# Patient Record
Sex: Female | Born: 1980 | Hispanic: No | Marital: Married | Smoking: Never smoker
Health system: Southern US, Community
[De-identification: ages and names within clinical notes are randomized; demographics above are authoritative.]

## PROBLEM LIST (undated history)

## (undated) DIAGNOSIS — E7800 Pure hypercholesterolemia, unspecified: Secondary | ICD-10-CM

## (undated) DIAGNOSIS — O24419 Gestational diabetes mellitus in pregnancy, unspecified control: Secondary | ICD-10-CM

## (undated) HISTORY — PX: OTHER SURGICAL HISTORY: SHX169

## (undated) HISTORY — PX: TONSILLECTOMY: SUR1361

## (undated) HISTORY — PX: APPENDECTOMY: SHX54

---

## 2002-07-08 ENCOUNTER — Other Ambulatory Visit: Admission: RE | Admit: 2002-07-08 | Discharge: 2002-07-08 | Payer: Self-pay | Admitting: Obstetrics and Gynecology

## 2002-07-12 ENCOUNTER — Encounter: Payer: Self-pay | Admitting: Obstetrics and Gynecology

## 2002-07-12 ENCOUNTER — Ambulatory Visit (HOSPITAL_COMMUNITY): Admission: RE | Admit: 2002-07-12 | Discharge: 2002-07-12 | Payer: Self-pay | Admitting: Obstetrics and Gynecology

## 2002-09-28 ENCOUNTER — Encounter (HOSPITAL_COMMUNITY): Admission: RE | Admit: 2002-09-28 | Discharge: 2002-10-04 | Payer: Self-pay | Admitting: Obstetrics and Gynecology

## 2002-10-07 ENCOUNTER — Inpatient Hospital Stay (HOSPITAL_COMMUNITY): Admission: AD | Admit: 2002-10-07 | Discharge: 2002-10-09 | Payer: Self-pay | Admitting: Obstetrics and Gynecology

## 2002-10-11 ENCOUNTER — Encounter: Admission: RE | Admit: 2002-10-11 | Discharge: 2002-11-10 | Payer: Self-pay | Admitting: Obstetrics and Gynecology

## 2003-09-02 ENCOUNTER — Other Ambulatory Visit: Admission: RE | Admit: 2003-09-02 | Discharge: 2003-09-02 | Payer: Self-pay | Admitting: Obstetrics and Gynecology

## 2005-06-07 ENCOUNTER — Other Ambulatory Visit: Admission: RE | Admit: 2005-06-07 | Discharge: 2005-06-07 | Payer: Self-pay | Admitting: Obstetrics and Gynecology

## 2006-02-06 ENCOUNTER — Inpatient Hospital Stay (HOSPITAL_COMMUNITY): Admission: AD | Admit: 2006-02-06 | Discharge: 2006-02-06 | Payer: Self-pay | Admitting: Obstetrics and Gynecology

## 2006-02-28 ENCOUNTER — Inpatient Hospital Stay (HOSPITAL_COMMUNITY): Admission: AD | Admit: 2006-02-28 | Discharge: 2006-02-28 | Payer: Self-pay | Admitting: Internal Medicine

## 2006-03-06 ENCOUNTER — Inpatient Hospital Stay (HOSPITAL_COMMUNITY): Admission: RE | Admit: 2006-03-06 | Discharge: 2006-03-08 | Payer: Self-pay | Admitting: Obstetrics and Gynecology

## 2006-05-10 ENCOUNTER — Emergency Department (HOSPITAL_COMMUNITY): Admission: EM | Admit: 2006-05-10 | Discharge: 2006-05-10 | Payer: Self-pay | Admitting: Emergency Medicine

## 2006-10-01 ENCOUNTER — Emergency Department (HOSPITAL_COMMUNITY): Admission: EM | Admit: 2006-10-01 | Discharge: 2006-10-01 | Payer: Self-pay | Admitting: Family Medicine

## 2015-07-09 ENCOUNTER — Emergency Department (INDEPENDENT_AMBULATORY_CARE_PROVIDER_SITE_OTHER): Payer: Federal, State, Local not specified - PPO

## 2015-07-09 ENCOUNTER — Encounter (HOSPITAL_COMMUNITY): Payer: Self-pay | Admitting: *Deleted

## 2015-07-09 ENCOUNTER — Emergency Department (INDEPENDENT_AMBULATORY_CARE_PROVIDER_SITE_OTHER)
Admission: EM | Admit: 2015-07-09 | Discharge: 2015-07-09 | Disposition: A | Discharge status: Home / Self Care | Payer: Federal, State, Local not specified - PPO | Source: Home / Self Care

## 2015-07-09 DIAGNOSIS — M7989 Other specified soft tissue disorders: Secondary | ICD-10-CM

## 2015-07-09 HISTORY — DX: Pure hypercholesterolemia, unspecified: E78.00

## 2015-07-09 MED ORDER — IBUPROFEN 800 MG PO TABS
ORAL_TABLET | ORAL | Status: AC
  Filled 2015-07-09: qty 1

## 2015-07-09 MED ORDER — IBUPROFEN 800 MG PO TABS
800.0000 mg | ORAL_TABLET | Freq: Once | ORAL | Status: AC
  Administered 2015-07-09: 800 mg via ORAL

## 2015-07-09 NOTE — Discharge Instructions (Signed)
Edema Edema is an abnormal buildup of fluids. It is more common in your legs and thighs. Painless swelling of the feet and ankles is more likely as a person ages. It also is common in looser skin, like around your eyes. HOME CARE   Keep the affected body part above the level of the heart while lying down.  Do not sit still or stand for a long time.  Do not put anything right under your knees when you lie down.  Do not wear tight clothes on your upper legs.  Exercise your legs to help the puffiness (swelling) go down.  Wear elastic bandages or support stockings as told by your doctor.  A low-salt diet may help lessen the puffiness.  Only take medicine as told by your doctor. GET HELP IF:  Treatment is not working.  You have heart, liver, or kidney disease and notice that your skin looks puffy or shiny.  You have puffiness in your legs that does not get better when you raise your legs.  You have sudden weight gain for no reason. GET HELP RIGHT AWAY IF:   You have shortness of breath or chest pain.  You cannot breathe when you lie down.  You have pain, redness, or warmth in the areas that are puffy.  You have heart, liver, or kidney disease and get edema all of a sudden.  You have a fever and your symptoms get worse all of a sudden. MAKE SURE YOU:   Understand these instructions.  Will watch your condition.  Will get help right away if you are not doing well or get worse.   This information is not intended to replace advice given to you by your health care provider. Make sure you discuss any questions you have with your health care provider. It appears you have swelling and overuse of right hand(dominant). Your xray was negative for fracture. Please rest, elevate, wear velcro wrist splint for comfort. May take OTC tylenol or ibuprofen as label directed. Follow up wth your PCP at [facility] in 3 days if no improvement, sooner if worse. Return to Urgent Care as  needed.   Document Released: 12/25/2007 Document Revised: 07/13/2013 Document Reviewed: 04/30/2013 Elsevier Interactive Patient Education [employer]2016 Elsevier Inc.

## 2015-07-09 NOTE — ED Provider Notes (Signed)
CSN: [id number]     Arrival date & time 07/09/15  1322 History   None    Chief Complaint  Patient presents with  . Edema   (Consider location/radiation/quality/duration/timing/severity/associated sxs/prior Treatment) HPI Comments: Pt denies any trauma or known injury, no insect bite  Patient is a 34 y.o. female presenting with hand pain. The history is provided by the patient. No language interpreter was used.  Hand Pain This is a new problem. The current episode started yesterday. The problem occurs constantly. The problem has been gradually worsening. Pertinent negatives include no chest pain, no abdominal pain, no headaches and no shortness of breath. Exacerbated by: movement. The symptoms are relieved by position and rest (pt states if she holds her hand still feels better. ). She has tried rest for the symptoms. The treatment provided mild relief.    Past Medical History  Diagnosis Date  . Hypercholesterolemia    Past Surgical History  Procedure Laterality Date  . Tonsillectomy    . Appendectomy    . Cesarean section     No family history on file. Social History  Substance Use Topics  . Smoking status: Never Smoker   . Smokeless tobacco: None  . Alcohol Use: No   OB History    No data available     Review of Systems  Constitutional: Positive for activity change. Negative for fever.  HENT: Negative.   Eyes: Negative.   Respiratory: Negative for shortness of breath.   Cardiovascular: Negative for chest pain.  Gastrointestinal: Negative for abdominal pain.  Endocrine: Negative.   Genitourinary: Negative.   Musculoskeletal: Positive for myalgias and arthralgias. Negative for back pain and gait problem.  Skin: Negative for color change and wound.  Neurological: Negative for headaches.  Hematological: Negative.   Psychiatric/Behavioral: Negative.   All other systems reviewed and are negative.   Allergies  Review of patient's allergies indicates no known  allergies.  Home Medications   Prior to Admission medications   Medication Sig Start Date End Date Taking? Authorizing Provider  Norethin-Eth Estrad-Fe Biphas (LO LOESTRIN FE PO) Take by mouth.   Yes Historical Provider, MD   Meds Ordered and Administered this Visit   Medications  ibuprofen (ADVIL,MOTRIN) tablet 800 mg (800 mg Oral Given 07/09/15 1427)    BP 118/73 mmHg  Pulse 82  Temp(Src) 98.1 F (36.7 C) (Oral)  Resp 18  SpO2 100%  LMP 07/06/2015 (Exact Date) No data found.   Physical Exam  Constitutional: She is oriented to person, place, and time. Vital signs are normal. She appears well-developed and well-nourished. She is active and cooperative.  Non-toxic appearance. She does not have a sickly appearance. She does not appear ill. She appears distressed.  HENT:  Head: Normocephalic.  Right Ear: Tympanic membrane normal.  Left Ear: Tympanic membrane normal.  Nose: Nose normal.  Mouth/Throat: Uvula is midline and mucous membranes are normal.  Eyes: Pupils are equal, round, and reactive to light.  Neck: Trachea normal and normal range of motion. Muscular tenderness present. No Brudzinski's sign and no Kernig's sign noted.  Cardiovascular: Normal rate, regular rhythm and normal pulses.   Pulses:      Radial pulses are 2+ on the right side.  Pulmonary/Chest: Effort normal and breath sounds normal.  Musculoskeletal:       Right hand: She exhibits decreased range of motion, tenderness, bony tenderness and swelling. She exhibits normal two-point discrimination, normal capillary refill, no deformity and no laceration. Normal sensation noted. Normal strength noted.  Right hand + TTP of 2/3 metacarpal, + mild swelling. NV intact, cap refill <3 sec. Good distal sensation/movement. + Phalen, Tinel test right  Neurological: She is alert and oriented to person, place, and time. She has normal strength. No cranial nerve deficit or sensory deficit. GCS eye subscore is 4. GCS verbal  subscore is 5. GCS motor subscore is 6.  Skin: Skin is warm, dry and intact. No bruising, no ecchymosis and no rash noted.  Psychiatric: She has a normal mood and affect. Her speech is normal and behavior is normal.  Nursing note and vitals reviewed.   ED Course  Procedures (including critical care time)  Labs Review Labs Reviewed - No data to display  Imaging Review Dg Hand Complete Right  07/09/2015  CLINICAL DATA:  34 year old female with right hand pain for 1 day. No known injury. EXAM: RIGHT HAND - COMPLETE 3+ VIEW COMPARISON:  None. FINDINGS: There is no evidence of fracture, subluxation or dislocation. Mild dorsal soft tissue swelling is noted. No focal bony lesions are present. IMPRESSION: Mild soft tissue swelling without bony abnormality. Electronically Signed   By: Harmon Pier M.D.   On: 07/09/2015 15:22        MDM   1. Swelling of right hand     Xray and ibuprofen ordered at 1415:   1530: Discussed plan of care and results with pt: Right hand swelling, DDX: right hand edema, trauma, overuse injury, gout considered. It appears you have swelling and overuse of right hand(dominant). Your xray was negative for fracture. Right velcro splint applied by staff NV intact pre/post splint application. Please rest, elevate, wear velcro wrist splint for comfort. May take OTC tylenol or ibuprofen as label directed. Follow up wth your PCP at [facility] in 3 days if no improvement, sooner if worse. Return to Urgent Care as needed. Pt verbalized understanding to this provider.   Clancy Gourd, NP 07/09/15 [phone]

## 2015-07-09 NOTE — ED Notes (Signed)
C/O swelling to right hand with painful movement of hand.  Denies any injury.

## 2015-09-09 ENCOUNTER — Emergency Department (HOSPITAL_COMMUNITY)
Admission: EM | Admit: 2015-09-09 | Discharge: 2015-09-10 | Disposition: A | Discharge status: Home / Self Care | Payer: Federal, State, Local not specified - PPO | Attending: Emergency Medicine | Admitting: Emergency Medicine

## 2015-09-09 ENCOUNTER — Encounter (HOSPITAL_COMMUNITY): Payer: Self-pay

## 2015-09-09 DIAGNOSIS — E7800 Pure hypercholesterolemia, unspecified: Secondary | ICD-10-CM | POA: Insufficient documentation

## 2015-09-09 DIAGNOSIS — Z793 Long term (current) use of hormonal contraceptives: Secondary | ICD-10-CM | POA: Diagnosis not present

## 2015-09-09 DIAGNOSIS — R519 Headache, unspecified: Secondary | ICD-10-CM

## 2015-09-09 DIAGNOSIS — R300 Dysuria: Secondary | ICD-10-CM | POA: Insufficient documentation

## 2015-09-09 DIAGNOSIS — Z3202 Encounter for pregnancy test, result negative: Secondary | ICD-10-CM | POA: Diagnosis not present

## 2015-09-09 DIAGNOSIS — R51 Headache: Secondary | ICD-10-CM | POA: Diagnosis not present

## 2015-09-09 DIAGNOSIS — Z79899 Other long term (current) drug therapy: Secondary | ICD-10-CM | POA: Diagnosis not present

## 2015-09-09 LAB — URINALYSIS, ROUTINE W REFLEX MICROSCOPIC
Bilirubin Urine: NEGATIVE
GLUCOSE, UA: NEGATIVE mg/dL
KETONES UR: NEGATIVE mg/dL
LEUKOCYTES UA: NEGATIVE
NITRITE: NEGATIVE
PH: 6.5 (ref 5.0–8.0)
Protein, ur: NEGATIVE mg/dL
SPECIFIC GRAVITY, URINE: 1.012 (ref 1.005–1.030)

## 2015-09-09 LAB — URINE MICROSCOPIC-ADD ON: BACTERIA UA: NONE SEEN

## 2015-09-09 LAB — POC URINE PREG, ED: Preg Test, Ur: NEGATIVE

## 2015-09-09 NOTE — ED Notes (Signed)
Pt here fo head ache x 3 days, dark urine yesterday evening and reports yellowing of finger nails.

## 2015-09-09 NOTE — ED Provider Notes (Addendum)
CSN: [id number]     Arrival date & time 09/09/15  1903 History  By signing my name below, I, Alexa Erickson, attest that this documentation has been prepared under the direction and in the presence of Shon Baton, MD. Electronically Signed: Randell Erickson, ED Scribe. 09/10/2015. 1:19 AM.   Chief Complaint  Erickson presents with  . Headache  . Dysuria   The history is provided by the Erickson and medical records. No language interpreter was used.   HPI Comments: Alexa Erickson is a 35 y.o. female with an hx of hypercholesterolemia who presents to the Emergency Department complaining of a constant, gradually worsening, 6/10 HA onset 3 days ago. Erickson reports that she was seen at [facility]. Has worst headache of her life. Denies vision changes. Reports dark urine yesterday. She has taken Advil without relief. Per Erickson, she has not been drinking much fluids recently. She denies an hx of HAs or similar symptoms in the past. Denies dysuria, vomiting, photophobia, and neck pain.  Denies fever.  Past Medical History  Diagnosis Date  . Hypercholesterolemia    Past Surgical History  Procedure Laterality Date  . Tonsillectomy    . Appendectomy    . Cesarean section     History reviewed. No pertinent family history. Social History  Substance Use Topics  . Smoking status: Never Smoker   . Smokeless tobacco: None  . Alcohol Use: No   OB History    No data available     Review of Systems  Constitutional: Negative for fever.  Eyes: Negative for photophobia and visual disturbance.  Gastrointestinal: Negative for vomiting.  Genitourinary: Negative for dysuria.  Musculoskeletal: Negative for neck pain.  Neurological: Positive for headaches.  All other systems reviewed and are negative.     Allergies  Review of Erickson's allergies indicates no known allergies.  Home Medications   Prior to Admission medications   Medication Sig Start Date End Date Taking?  Authorizing Provider  atorvastatin (LIPITOR) 40 MG tablet Take 40 mg by mouth daily.   Yes Historical Provider, MD  ibuprofen (ADVIL,MOTRIN) 200 MG tablet Take 400 mg by mouth every 6 (six) hours as needed for headache or moderate pain.   Yes Historical Provider, MD  Vitamin D, Ergocalciferol, (DRISDOL) 50000 units CAPS capsule Take 50,000 Units by mouth every 7 (seven) days.   Yes Historical Provider, MD  Norethin-Eth Estrad-Fe Biphas (LO LOESTRIN FE PO) Take 1 tablet by mouth daily.     Historical Provider, MD   BP 142/85 mmHg  Pulse 79  Temp(Src) 98.5 F (36.9 C) (Oral)  Resp 20  Ht  (1.6 m)  Wt 169 lb (76.658 kg)  BMI 29.94 kg/m2  SpO2 100% Physical Exam  Constitutional: She is oriented to person, place, and time. She appears well-developed and well-nourished. No distress.  HENT:  Head: Normocephalic and atraumatic.  Eyes: EOM are normal. Pupils are equal, round, and reactive to light.  Cardiovascular: Normal rate, regular rhythm and normal heart sounds.   No murmur heard. Pulmonary/Chest: Effort normal and breath sounds normal. No respiratory distress. She has no wheezes.  Abdominal: Soft. There is no tenderness.  Neurological: She is alert and oriented to person, place, and time.  Cranial nerves II through XII intact, 5 out of 5 strength in all 4 extremities, no dysmetria to finger-nose-finger, visual fields intact  Skin: Skin is warm and dry.  Psychiatric: She has a normal mood and affect.  Nursing note and vitals reviewed.   ED  Course  Procedures   DIAGNOSTIC STUDIES: Oxygen Saturation is 100% on RA, normal by my interpretation.    COORDINATION OF CARE: 12:05 AM Discussed lab results with Erickson. Will order IV fluids, Benadryl, Toradol, and Compazine. Discussed treatment plan with pt at bedside and pt agreed to plan.   Labs Review Labs Reviewed  URINALYSIS, ROUTINE W REFLEX MICROSCOPIC (NOT AT [facility]) - Abnormal; Notable for the following:    Hgb urine dipstick  TRACE (*)    All other components within normal limits  URINE MICROSCOPIC-ADD ON - Abnormal; Notable for the following:    Squamous Epithelial / LPF 0-5 (*)    All other components within normal limits  URINE CULTURE  POC URINE PREG, ED    Imaging Review No results found. I have personally reviewed and evaluated these images and lab results as part of my medical decision-making.   EKG Interpretation None      MDM   Final diagnoses:  Acute nonintractable headache, unspecified headache type    Erickson presents with headache. No history of headaches. Onset 3 days ago. Denies worst headache of her life. Is comfortable appearing. Neurologically intact. No additional signs or symptoms. No red flags. She is afebrile without neck pain. Doubt meningitis or subarachnoid hemorrhage. She is on birth control which would put her at risk for possible venous dural thrombosis; however, she is without neurologic deficit or further complaint. Her headache improved with a migraine cocktail. No indication at this time for emergent imaging. If headache recurs or worsens, she will need to be reevaluated.  After history, exam, and medical workup I feel the Erickson has been appropriately medically screened and is safe for discharge home. Pertinent diagnoses were discussed with the Erickson. Erickson was given return precautions.  I personally performed the services described in this documentation, which was scribed in my presence. The recorded information has been reviewed and is accurate.    Shon Baton, MD 09/10/15 0120  Shon Baton, MD 09/10/15 [phone]

## 2015-09-10 MED ORDER — SODIUM CHLORIDE 0.9 % IV BOLUS (SEPSIS): 1000.0000 mL | Freq: Once | INTRAVENOUS | Status: DC

## 2015-09-10 MED ORDER — PROCHLORPERAZINE EDISYLATE 5 MG/ML IJ SOLN
10.0000 mg | Freq: Four times a day (QID) | INTRAMUSCULAR | Status: DC | PRN
  Administered 2015-09-10: 10 mg via INTRAVENOUS
  Filled 2015-09-10: qty 2

## 2015-09-10 MED ORDER — KETOROLAC TROMETHAMINE 30 MG/ML IJ SOLN
30.0000 mg | Freq: Once | INTRAMUSCULAR | Status: AC
  Administered 2015-09-10: 30 mg via INTRAVENOUS
  Filled 2015-09-10: qty 1

## 2015-09-10 MED ORDER — DIPHENHYDRAMINE HCL 50 MG/ML IJ SOLN
25.0000 mg | Freq: Once | INTRAMUSCULAR | Status: AC
  Administered 2015-09-10: 25 mg via INTRAVENOUS
  Filled 2015-09-10: qty 1

## 2015-09-10 NOTE — ED Notes (Signed)
Saline bag would not scan, was administered.

## 2015-09-10 NOTE — Discharge Instructions (Signed)

## 2015-09-11 LAB — URINE CULTURE

## 2016-01-31 ENCOUNTER — Encounter (HOSPITAL_COMMUNITY): Payer: Self-pay | Admitting: Emergency Medicine

## 2016-01-31 ENCOUNTER — Ambulatory Visit (HOSPITAL_COMMUNITY)
Admission: EM | Admit: 2016-01-31 | Discharge: 2016-01-31 | Disposition: A | Discharge status: Home / Self Care | Payer: Federal, State, Local not specified - PPO | Attending: Family Medicine | Admitting: Family Medicine

## 2016-01-31 DIAGNOSIS — Z8249 Family history of ischemic heart disease and other diseases of the circulatory system: Secondary | ICD-10-CM | POA: Diagnosis not present

## 2016-01-31 DIAGNOSIS — J069 Acute upper respiratory infection, unspecified: Secondary | ICD-10-CM | POA: Diagnosis not present

## 2016-01-31 DIAGNOSIS — J029 Acute pharyngitis, unspecified: Secondary | ICD-10-CM | POA: Diagnosis present

## 2016-01-31 DIAGNOSIS — Z79899 Other long term (current) drug therapy: Secondary | ICD-10-CM | POA: Insufficient documentation

## 2016-01-31 DIAGNOSIS — E7800 Pure hypercholesterolemia, unspecified: Secondary | ICD-10-CM | POA: Insufficient documentation

## 2016-01-31 LAB — POCT RAPID STREP A: Streptococcus, Group A Screen (Direct): NEGATIVE

## 2016-01-31 MED ORDER — BENZONATATE 200 MG PO CAPS: 200.0000 mg | ORAL_CAPSULE | Freq: Three times a day (TID) | ORAL | Status: DC | PRN

## 2016-01-31 MED ORDER — AMOXICILLIN 500 MG PO CAPS
500.0000 mg | ORAL_CAPSULE | Freq: Three times a day (TID) | ORAL | Status: DC
Start: 2016-01-31 — End: 2019-08-19

## 2016-01-31 NOTE — Discharge Instructions (Signed)
Upper Respiratory Infection, Adult Most upper respiratory infections (URIs) are a viral infection of the air passages leading to the lungs. A URI affects the nose, throat, and upper air passages. The most common type of URI is nasopharyngitis and is typically referred to as "the common cold." URIs run their course and usually go away on their own. Most of the time, a URI does not require medical attention, but sometimes a bacterial infection in the upper airways can follow a viral infection. This is called a secondary infection. Sinus and middle ear infections are common types of secondary upper respiratory infections. Bacterial pneumonia can also complicate a URI. A URI can worsen asthma and chronic obstructive pulmonary disease (COPD). Sometimes, these complications can require emergency medical care and may be life threatening.  CAUSES Almost all URIs are caused by viruses. A virus is a type of germ and can spread from one person to another.  RISKS FACTORS You may be at risk for a URI if:   You smoke.   You have chronic heart or lung disease.  You have a weakened defense (immune) system.   You are very young or very old.   You have nasal allergies or asthma.  You work in crowded or poorly ventilated areas.  You work in health care facilities or schools. SIGNS AND SYMPTOMS  Symptoms typically develop 2-3 days after you come in contact with a cold virus. Most viral URIs last 7-10 days. However, viral URIs from the influenza virus (flu virus) can last 14-18 days and are typically more severe. Symptoms may include:   Runny or stuffy (congested) nose.   Sneezing.   Cough.   Sore throat.   Headache.   Fatigue.   Fever.   Loss of appetite.   Pain in your forehead, behind your eyes, and over your cheekbones (sinus pain).  Muscle aches.  DIAGNOSIS  Your health care provider may diagnose a URI by:  Physical exam.  Tests to check that your symptoms are not due to  another condition such as:  Strep throat.  Sinusitis.  Pneumonia.  Asthma. TREATMENT  A URI goes away on its own with time. It cannot be cured with medicines, but medicines may be prescribed or recommended to relieve symptoms. Medicines may help:  Reduce your fever.  Reduce your cough.  Relieve nasal congestion. HOME CARE INSTRUCTIONS   Take medicines only as directed by your health care provider.   Gargle warm saltwater or take cough drops to comfort your throat as directed by your health care provider.  Use a warm mist humidifier or inhale steam from a shower to increase air moisture. This may make it easier to breathe.  Drink enough fluid to keep your urine clear or pale yellow.   Eat soups and other clear broths and maintain good nutrition.   Rest as needed.   Return to work when your temperature has returned to normal or as your health care provider advises. You may need to stay home longer to avoid infecting others. You can also use a face mask and careful hand washing to prevent spread of the virus.  Increase the usage of your inhaler if you have asthma.   Do not use any tobacco products, including cigarettes, chewing tobacco, or electronic cigarettes. If you need help quitting, ask your health care provider. PREVENTION  The best way to protect yourself from getting a cold is to practice good hygiene.   Avoid oral or hand contact with people with cold   symptoms.   Wash your hands often if contact occurs.  There is no clear evidence that vitamin C, vitamin E, echinacea, or exercise reduces the chance of developing a cold. However, it is always recommended to get plenty of rest, exercise, and practice good nutrition.  SEEK MEDICAL CARE IF:   You are getting worse rather than better.   Your symptoms are not controlled by medicine.   You have chills.  You have worsening shortness of breath.  You have brown or red mucus.  You have yellow or brown nasal  discharge.  You have pain in your face, especially when you bend forward.  You have a fever.  You have swollen neck glands.  You have pain while swallowing.  You have white areas in the back of your throat. SEEK IMMEDIATE MEDICAL CARE IF:   You have severe or persistent:  Headache.  Ear pain.  Sinus pain.  Chest pain.  You have chronic lung disease and any of the following:  Wheezing.  Prolonged cough.  Coughing up blood.  A change in your usual mucus.  You have a stiff neck.  You have changes in your:  Vision.  Hearing.  Thinking.  Mood. MAKE SURE YOU:   Understand these instructions.  Will watch your condition.  Will get help right away if you are not doing well or get worse.   This information is not intended to replace advice given to you by your health care provider. Make sure you discuss any questions you have with your health care provider.   Document Released: 01/01/2001 Document Revised: 11/22/2014 Document Reviewed: 10/13/2013 Elsevier Interactive Patient Education 2016 Elsevier Inc.  

## 2016-01-31 NOTE — ED Provider Notes (Signed)
CSN: [id number]     Arrival date & time 01/31/16  1948 History   First MD Initiated Contact with Patient 01/31/16 2030     Chief Complaint  Patient presents with  . Sore Throat   (Consider location/radiation/quality/duration/timing/severity/associated sxs/prior Treatment) HPI History obtained from patient:  Pt presents with the cc of:  Sore throat, cough Duration of symptoms: Several days Treatment prior to arrival: Over-the-counter medications without relief of symptoms Context: Sudden onset of cough and sore throat. She has children with similar symptoms at home. Other symptoms include: No fevers Pain score: 0 FAMILY HISTORY: Hypertension    Past Medical History  Diagnosis Date  . Hypercholesterolemia    Past Surgical History  Procedure Laterality Date  . Tonsillectomy    . Appendectomy    . Cesarean section     No family history on file. Social History  Substance Use Topics  . Smoking status: Never Smoker   . Smokeless tobacco: Not on file  . Alcohol Use: No   OB History    No data available     Review of Systems  Denies: HEADACHE, NAUSEA, ABDOMINAL PAIN, CHEST PAIN, CONGESTION, DYSURIA, SHORTNESS OF BREATH  Allergies  Review of patient's allergies indicates no known allergies.  Home Medications   Prior to Admission medications   Medication Sig Start Date End Date Taking? Authorizing Provider  amoxicillin (AMOXIL) 500 MG capsule Take 1 capsule (500 mg total) by mouth 3 (three) times daily. 01/31/16   Tharon AquasFrank C Patrick, PA  atorvastatin (LIPITOR) 40 MG tablet Take 40 mg by mouth daily.    Historical Provider, MD  benzonatate (TESSALON) 200 MG capsule Take 1 capsule (200 mg total) by mouth 3 (three) times daily as needed for cough. 01/31/16   Tharon AquasFrank C Patrick, PA  ibuprofen (ADVIL,MOTRIN) 200 MG tablet Take 400 mg by mouth every 6 (six) hours as needed for headache or moderate pain.    Historical Provider, MD  Norethin-Eth Estrad-Fe Biphas (LO LOESTRIN FE PO) Take 1  tablet by mouth daily.     Historical Provider, MD  Vitamin D, Ergocalciferol, (DRISDOL) 50000 units CAPS capsule Take 50,000 Units by mouth every 7 (seven) days.    Historical Provider, MD   Meds Ordered and Administered this Visit  Medications - No data to display  BP 104/51 mmHg  Pulse 82  Temp(Src) 98.5 F (36.9 C) (Oral)  Resp 16  SpO2 99% No data found.   Physical Exam   NURSES NOTES AND VITAL SIGNS REVIEWED. CONSTITUTIONAL: Well developed, well nourished, no acute distress HEENT: normocephalic, atraumatic EYES: Conjunctiva normal NECK:normal ROM, supple, no adenopathy PULMONARY:No respiratory distress, normal effort ABDOMINAL: Soft, ND, NT BS+, No CVAT MUSCULOSKELETAL: Normal ROM of all extremities,  SKIN: warm and dry without rash PSYCHIATRIC: Mood and affect, behavior are normal    ED Course  Procedures (including critical care time)  Labs Review Labs Reviewed  POCT RAPID STREP A    Imaging Review No results found.   Visual Acuity Review  Right Eye Distance:   Left Eye Distance:   Bilateral Distance:    Right Eye Near:   Left Eye Near:    Bilateral Near:         MDM   1. URI (upper respiratory infection)     Patient is reassured that there are no issues that require transfer to higher level of care at this time or additional tests. Patient is advised to continue home symptomatic treatment. Patient is advised that if there are  new or worsening symptoms to attend the emergency department, contact primary care provider, or return to UC. Instructions of care provided discharged home in stable condition.    THIS NOTE WAS GENERATED USING A VOICE RECOGNITION SOFTWARE PROGRAM. ALL REASONABLE EFFORTS  WERE MADE TO PROOFREAD THIS DOCUMENT FOR ACCURACY.  I have verbally reviewed the discharge instructions with the patient. A printed AVS was given to the patient.  All questions were answered prior to discharge.      Tharon Aquas, PA 01/31/16  2056

## 2016-02-03 LAB — CULTURE, GROUP A STREP (THRC)

## 2016-05-06 DIAGNOSIS — Z01419 Encounter for gynecological examination (general) (routine) without abnormal findings: Secondary | ICD-10-CM | POA: Diagnosis not present

## 2016-05-06 DIAGNOSIS — Z6829 Body mass index (BMI) 29.0-29.9, adult: Secondary | ICD-10-CM | POA: Diagnosis not present

## 2016-05-06 DIAGNOSIS — Z130 Encounter for screening for diseases of the blood and blood-forming organs and certain disorders involving the immune mechanism: Secondary | ICD-10-CM | POA: Diagnosis not present

## 2016-05-06 DIAGNOSIS — Z3041 Encounter for surveillance of contraceptive pills: Secondary | ICD-10-CM | POA: Diagnosis not present

## 2016-05-06 DIAGNOSIS — Z1389 Encounter for screening for other disorder: Secondary | ICD-10-CM | POA: Diagnosis not present

## 2016-07-30 DIAGNOSIS — E559 Vitamin D deficiency, unspecified: Secondary | ICD-10-CM | POA: Diagnosis not present

## 2016-07-30 DIAGNOSIS — Z0000 Encounter for general adult medical examination without abnormal findings: Secondary | ICD-10-CM | POA: Diagnosis not present

## 2016-07-30 DIAGNOSIS — E7801 Familial hypercholesterolemia: Secondary | ICD-10-CM | POA: Diagnosis not present

## 2016-09-12 DIAGNOSIS — K080 Exfoliation of teeth due to systemic causes: Secondary | ICD-10-CM | POA: Diagnosis not present

## 2016-12-27 ENCOUNTER — Ambulatory Visit (HOSPITAL_COMMUNITY)
Admission: EM | Admit: 2016-12-27 | Discharge: 2016-12-27 | Disposition: A | Discharge status: Home / Self Care | Payer: Federal, State, Local not specified - PPO | Attending: Internal Medicine | Admitting: Internal Medicine

## 2016-12-27 ENCOUNTER — Encounter (HOSPITAL_COMMUNITY): Payer: Self-pay

## 2016-12-27 DIAGNOSIS — R0789 Other chest pain: Secondary | ICD-10-CM

## 2016-12-27 DIAGNOSIS — M940 Chondrocostal junction syndrome [Tietze]: Secondary | ICD-10-CM | POA: Diagnosis not present

## 2016-12-27 NOTE — ED Provider Notes (Signed)
CSN: [id number]     Arrival date & time 12/27/16  1304 History   First MD Initiated Contact with Patient 12/27/16 1410     Chief Complaint  Patient presents with  . Chest Pain   (Consider location/radiation/quality/duration/timing/severity/associated sxs/prior Treatment) 36 year old female from [place] speaks English is complaining of left anterior chest pain that radiates to the left lateral chest. For the first couple weeks it would come and go. But in the past 3 days has been more constant. She describes it as a pressure and points toward the center of the chest and sometimes the left upper anterior chest and left lateral chest. It occurs now constantly nearly every day. He is not affected by movement or breathing. Denies shortness of breath, diaphoresis, weakness or fatigue. 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      Past Medical History:  Diagnosis Date  . Hypercholesterolemia    Past Surgical History:  Procedure Laterality Date  . APPENDECTOMY    . CESAREAN SECTION    . TONSILLECTOMY     No family history on file. Social History  Substance Use Topics  . Smoking status: Never Smoker  . Smokeless tobacco: Never Used  . Alcohol use No   OB History    No data available     Review of Systems  Constitutional: Negative.   HENT: Negative.   Respiratory: Negative.  Negative for cough and shortness of breath.   Cardiovascular: Positive for chest pain. Negative for palpitations and leg swelling.  Gastrointestinal: Negative.   Genitourinary: Negative.   Skin: Negative.   Neurological: Negative.   Psychiatric/Behavioral: Negative.   All other systems reviewed and are negative.   Allergies  Patient has no known allergies.  Home Medications   Prior to Admission medications   Medication Sig Start Date End Date Taking? Authorizing Provider  atorvastatin (LIPITOR) 40 MG tablet Take 40 mg by mouth daily.   Yes [provider]   Vitamin D, Ergocalciferol, (DRISDOL) 50000 units CAPS capsule Take 50,000 Units by mouth every 7 (seven) days.   Yes [provider]  amoxicillin (AMOXIL) 500 MG capsule Take 1 capsule (500 mg total) by mouth 3 (three) times daily. 01/31/16   Tharon AquasPatrick, Frank C, PA  benzonatate (TESSALON) 200 MG capsule Take 1 capsule (200 mg total) by mouth 3 (three) times daily as needed for cough. 01/31/16   Tharon AquasPatrick, Frank C, PA  ibuprofen (ADVIL,MOTRIN) 200 MG tablet Take 400 mg by mouth every 6 (six) hours as needed for headache or moderate pain.    [provider]  Norethin-Eth Estrad-Fe Biphas (LO LOESTRIN FE PO) Take 1 tablet by mouth daily.     [provider]   Meds Ordered and Administered this Visit  Medications - No data to display  BP 122/81 (BP Location: Right Arm)   Pulse 80   Temp 98.5 F (36.9 C) (Oral)   Resp 20   LMP 11/28/2016 (Exact Date)   SpO2 98%  No data found.   Physical Exam  Constitutional: She is oriented to person, place, and time. She appears well-developed and well-nourished. No distress.  HENT:  Right Ear: External ear normal.  Left Ear: External ear normal.  Eyes: EOM are normal.  Neck: Normal range of motion. Neck supple.  Cardiovascular: Normal rate, regular rhythm, normal heart sounds and intact distal pulses.   Pulmonary/Chest: Effort normal and breath sounds normal. No respiratory distress. She has no wheezes. She has  no rales. She exhibits tenderness.  Reproducible chest wall tenderness along the parasternal borders greatest on the right. Lesser tenderness to the left upper most anterior chest. Tenderness to the left lateral chest at the posterior axillary line and a small area.  Lungs are perfectly clear. Excellent expansion and air movement.  Musculoskeletal: Normal range of motion. She exhibits no edema.  Neurological: She is alert and oriented to person, place, and time.  Skin: Skin is warm and dry. Capillary refill takes less than  2 seconds. No erythema.  Psychiatric: She has a normal mood and affect. Her behavior is normal. Thought content normal.  Nursing note and vitals reviewed.   Urgent Care Course     Procedures (including critical care time)  Labs Review Labs Reviewed - No data to display  Imaging Review No results found.  ED ECG REPORT   Date: 12/27/2016  Rate: 68  Rhythm: normal sinus rhythm  QRS Axis: normal  Intervals: normal  ST/T Wave abnormalities: normal  Conduction Disutrbances:none  Narrative Interpretation:   Old EKG Reviewed: unchanged  I have personally reviewed the EKG tracing and agree with the computerized printout as noted. Visual Acuity Review  Right Eye Distance:   Left Eye Distance:   Bilateral Distance:    Right Eye Near:   Left Eye Near:    Bilateral Near:         MDM   1. Chest wall pain   2. Costochondritis    There are no convincing signs or symptoms of heart or lung disease at this time. You do have reproducible chest wall tenderness which is reassuring in regards to the diagnosis. This means you have tenderness in the cartilage adjacent to the breast bone as well as other parts of the chest. Your heart sounds normal, regular rate and rhythm, lungs are clear and your EKG is normal. Place ice on the areas of the chest with the greatest amount of discomfort. He may also take ibuprofen 600 mg every 6 hours as needed for discomfort. Realized that this type pain sometimes can last for several weeks. Follow-up with your primary care doctor as needed.    Hayden Rasmussen, NP 12/27/16 1428

## 2016-12-27 NOTE — ED Triage Notes (Signed)
Pt has been having chest pain for 2 weeks intermittently and now has been having it non stop for the past two days. It's on the left side of her chest radiating to her left side and is having some pain/tightness in her upper shoulder area on the right side and back of her neck. Did try bengay and it didn't help.

## 2016-12-27 NOTE — Discharge Instructions (Signed)
There are no convincing signs or symptoms of heart or lung disease at this time. You do have reproducible chest wall tenderness which is reassuring in regards to the diagnosis. This means you have tenderness in the cartilage adjacent to the breast bone as well as other parts of the chest. Your heart sounds normal, regular rate and rhythm, lungs are clear and your EKG is normal. Place ice on the areas of the chest with the greatest amount of discomfort. He may also take ibuprofen 600 mg every 6 hours as needed for discomfort. Realized that this type pain sometimes can last for several weeks. Follow-up with your primary care doctor as needed.

## 2017-03-18 DIAGNOSIS — K080 Exfoliation of teeth due to systemic causes: Secondary | ICD-10-CM | POA: Diagnosis not present

## 2017-04-17 DIAGNOSIS — K080 Exfoliation of teeth due to systemic causes: Secondary | ICD-10-CM | POA: Diagnosis not present

## 2017-08-02 ENCOUNTER — Ambulatory Visit (INDEPENDENT_AMBULATORY_CARE_PROVIDER_SITE_OTHER): Payer: Federal, State, Local not specified - PPO

## 2017-08-02 ENCOUNTER — Ambulatory Visit (HOSPITAL_COMMUNITY)
Admission: EM | Admit: 2017-08-02 | Discharge: 2017-08-02 | Disposition: A | Discharge status: Home / Self Care | Payer: Federal, State, Local not specified - PPO | Attending: Family Medicine | Admitting: Family Medicine

## 2017-08-02 ENCOUNTER — Encounter (HOSPITAL_COMMUNITY): Payer: Self-pay | Admitting: *Deleted

## 2017-08-02 ENCOUNTER — Other Ambulatory Visit: Payer: Self-pay

## 2017-08-02 DIAGNOSIS — R0789 Other chest pain: Secondary | ICD-10-CM | POA: Diagnosis not present

## 2017-08-02 DIAGNOSIS — R05 Cough: Secondary | ICD-10-CM | POA: Diagnosis not present

## 2017-08-02 DIAGNOSIS — R079 Chest pain, unspecified: Secondary | ICD-10-CM | POA: Diagnosis not present

## 2017-08-02 MED ORDER — NAPROXEN 500 MG PO TABS
500.0000 mg | ORAL_TABLET | Freq: Two times a day (BID) | ORAL | 0 refills | Status: DC
Start: 2017-08-02 — End: 2019-08-19

## 2017-08-02 NOTE — ED Provider Notes (Signed)
[facility]    CSN: [id number] Arrival date & time: 08/02/17  1421     History   Chief Complaint Chief Complaint  Patient presents with  . Chest Pain    HPI Alexa Erickson is a 37 y.o. female.   37 year old female, presenting today complaining of right-sided chest pain with cough.  Describes the pain is a pressure sensation that causes her to cough.  States that she has had this several times in the past for which she has been evaluated here.  States that last time, she was told it was due to inflammation.  She denies any shortness of breath, fever, chills, leg swelling.  Denies any hemoptysis or recent travel, surgeries or prolonged immobilization.  Denies history of DVT/PE.   The history is provided by the patient.  Chest Pain  Pain location:  R chest Pain quality: aching   Pain radiates to:  Does not radiate Pain severity:  Mild Onset quality:  Gradual Duration:  1 day Timing:  Intermittent Progression:  Unchanged Chronicity:  Recurrent Context: not breathing, not drug use, not eating, not intercourse, not lifting, not movement, not raising an arm, not at rest, not stress and not trauma   Context comment:  Coughing  Relieved by:  Nothing Worsened by:  Nothing Ineffective treatments:  None tried Associated symptoms: cough   Associated symptoms: no abdominal pain, no AICD problem, no altered mental status, no anorexia, no anxiety, no back pain, no claudication, no diaphoresis, no dizziness, no dysphagia, no fatigue, no fever, no headache, no heartburn, no lower extremity edema, no nausea, no near-syncope, no numbness, no orthopnea, no palpitations, no PND, no shortness of breath, no syncope, no vomiting and no weakness   Risk factors: birth control (OCP)   Risk factors: no aortic disease, no coronary artery disease, no diabetes mellitus, no Ehlers-Danlos syndrome, no high cholesterol, no hypertension, no immobilization, not female, no Marfan's syndrome, not  obese, not pregnant, no prior DVT/PE, no smoking and no surgery     Past Medical History:  Diagnosis Date  . Hypercholesterolemia     There are no active problems to display for this patient.   Past Surgical History:  Procedure Laterality Date  . APPENDECTOMY    . CESAREAN SECTION    . TONSILLECTOMY      OB History    No data available       Home Medications    Prior to Admission medications   Medication Sig Start Date End Date Taking? Authorizing Provider  atorvastatin (LIPITOR) 40 MG tablet Take 40 mg by mouth daily.   Yes [provider]  Norethin-Eth Estrad-Fe Biphas (LO LOESTRIN FE PO) Take 1 tablet by mouth daily.    Yes [provider]  Vitamin D, Ergocalciferol, (DRISDOL) 50000 units CAPS capsule Take 50,000 Units by mouth every 7 (seven) days.   Yes [provider]  amoxicillin (AMOXIL) 500 MG capsule Take 1 capsule (500 mg total) by mouth 3 (three) times daily. 01/31/16   Tharon Aquas, PA  benzonatate (TESSALON) 200 MG capsule Take 1 capsule (200 mg total) by mouth 3 (three) times daily as needed for cough. 01/31/16   Tharon Aquas, PA  ibuprofen (ADVIL,MOTRIN) 200 MG tablet Take 400 mg by mouth every 6 (six) hours as needed for headache or moderate pain.    [provider]  naproxen (NAPROSYN) 500 MG tablet Take 1 tablet (500 mg total) by mouth 2 (two) times daily. 08/02/17   Blue,  Marylene Landlivia C, PA-C    Family History No family history on file.  Social History Social History   Tobacco Use  . Smoking status: Never Smoker  . Smokeless tobacco: Never Used  Substance Use Topics  . Alcohol use: No  . Drug use: No     Allergies   Patient has no known allergies.   Review of Systems Review of Systems  Constitutional: Negative for chills, diaphoresis, fatigue and fever.  HENT: Negative for ear pain, sore throat and trouble swallowing.   Eyes: Negative for pain and visual disturbance.  Respiratory: Positive for cough.  Negative for shortness of breath.   Cardiovascular: Positive for chest pain. Negative for palpitations, orthopnea, claudication, syncope, PND and near-syncope.  Gastrointestinal: Negative for abdominal pain, anorexia, heartburn, nausea and vomiting.  Genitourinary: Negative for dysuria and hematuria.  Musculoskeletal: Negative for arthralgias and back pain.  Skin: Negative for color change and rash.  Neurological: Negative for dizziness, seizures, syncope, weakness, numbness and headaches.  All other systems reviewed and are negative.    Physical Exam Triage Vital Signs ED Triage Vitals  Enc Vitals Group     BP 08/02/17 1508 121/80     Pulse Rate 08/02/17 1508 82     Resp 08/02/17 1508 16     Temp 08/02/17 1508 98 F (36.7 C)     Temp Source 08/02/17 1508 Oral     SpO2 08/02/17 1508 100 %     Weight --      Height --      Head Circumference --      Peak Flow --      Pain Score 08/02/17 1511 8     Pain Loc --      Pain Edu? --      Excl. in GC? --    No data found.  Updated Vital Signs BP 121/80 (BP Location: Left Arm)   Pulse 82   Temp 98 F (36.7 C) (Oral)   Resp 16   LMP 07/27/2017 (Exact Date)   SpO2 100%   Visual Acuity Right Eye Distance:   Left Eye Distance:   Bilateral Distance:    Right Eye Near:   Left Eye Near:    Bilateral Near:     Physical Exam  Constitutional: She appears well-developed and well-nourished. No distress.  HENT:  Head: Normocephalic and atraumatic.  Eyes: Conjunctivae are normal.  Neck: Neck supple.  Cardiovascular: Normal rate and regular rhythm.  No murmur heard. Pulmonary/Chest: Effort normal and breath sounds normal. No stridor. No respiratory distress. She has no decreased breath sounds. She has no wheezes. She has no rhonchi. She has no rales. She exhibits no tenderness.  Abdominal: Soft. There is no tenderness.  Musculoskeletal: She exhibits no edema.  Neurological: She is alert.  Skin: Skin is warm and dry.    Psychiatric: She has a normal mood and affect.  Nursing note and vitals reviewed.    UC Treatments / Results  Labs (all labs ordered are listed, but only abnormal results are displayed) Labs Reviewed - No data to display  EKG  EKG Interpretation None       Radiology Dg Chest 2 View  Result Date: 08/02/2017 CLINICAL DATA:  Cough, chest pain EXAM: CHEST  2 VIEW COMPARISON:  None. FINDINGS: Heart and mediastinal contours are within normal limits. No focal opacities or effusions. No acute bony abnormality. IMPRESSION: No active cardiopulmonary disease. Electronically Signed   By: Charlett NoseKevin  Dover M.D.   On: 08/02/2017 15:58  Procedures Procedures (including critical care time)  Medications Ordered in UC Medications - No data to display   Initial Impression / Assessment and Plan / UC Course  I have reviewed the triage vital signs and the nursing notes.  Pertinent labs & imaging results that were available during my care of the patient were reviewed by me and considered in my medical decision making (see chart for details).     EKG shows normal sinus rhythm Normal chest x-ray Pain likely secondary to chest wall pain We will treat symptomatically with anti-inflammatories  Final Clinical Impressions(s) / UC Diagnoses   Final diagnoses:  Chest wall pain    ED Discharge Orders        Ordered    naproxen (NAPROSYN) 500 MG tablet  2 times daily     08/02/17 1609       Controlled Substance Prescriptions [place] Controlled Substance Registry consulted? Not Applicable   Alecia Lemming, [place] 08/02/17 1609

## 2017-08-02 NOTE — ED Triage Notes (Signed)
Pt c/o chest pressure which causes her to cough, followed by relief of pressure.  States has had same pain in past & has been told "it's inflammation".

## 2017-08-18 DIAGNOSIS — E559 Vitamin D deficiency, unspecified: Secondary | ICD-10-CM | POA: Diagnosis not present

## 2017-08-18 DIAGNOSIS — E785 Hyperlipidemia, unspecified: Secondary | ICD-10-CM | POA: Diagnosis not present

## 2017-09-12 DIAGNOSIS — Z124 Encounter for screening for malignant neoplasm of cervix: Secondary | ICD-10-CM | POA: Diagnosis not present

## 2017-09-12 DIAGNOSIS — Z01419 Encounter for gynecological examination (general) (routine) without abnormal findings: Secondary | ICD-10-CM | POA: Diagnosis not present

## 2017-09-12 DIAGNOSIS — Z3041 Encounter for surveillance of contraceptive pills: Secondary | ICD-10-CM | POA: Diagnosis not present

## 2017-09-12 DIAGNOSIS — Z1389 Encounter for screening for other disorder: Secondary | ICD-10-CM | POA: Diagnosis not present

## 2017-09-12 DIAGNOSIS — Z6830 Body mass index (BMI) 30.0-30.9, adult: Secondary | ICD-10-CM | POA: Diagnosis not present

## 2017-09-12 DIAGNOSIS — Z1151 Encounter for screening for human papillomavirus (HPV): Secondary | ICD-10-CM | POA: Diagnosis not present

## 2017-09-12 DIAGNOSIS — Z130 Encounter for screening for diseases of the blood and blood-forming organs and certain disorders involving the immune mechanism: Secondary | ICD-10-CM | POA: Diagnosis not present

## 2017-10-31 DIAGNOSIS — E785 Hyperlipidemia, unspecified: Secondary | ICD-10-CM | POA: Diagnosis not present

## 2017-10-31 DIAGNOSIS — Z0000 Encounter for general adult medical examination without abnormal findings: Secondary | ICD-10-CM | POA: Diagnosis not present

## 2017-10-31 DIAGNOSIS — L659 Nonscarring hair loss, unspecified: Secondary | ICD-10-CM | POA: Diagnosis not present

## 2017-10-31 DIAGNOSIS — E559 Vitamin D deficiency, unspecified: Secondary | ICD-10-CM | POA: Diagnosis not present

## 2017-12-18 DIAGNOSIS — M25531 Pain in right wrist: Secondary | ICD-10-CM | POA: Diagnosis not present

## 2017-12-29 DIAGNOSIS — K080 Exfoliation of teeth due to systemic causes: Secondary | ICD-10-CM | POA: Diagnosis not present

## 2018-04-13 DIAGNOSIS — E785 Hyperlipidemia, unspecified: Secondary | ICD-10-CM | POA: Diagnosis not present

## 2018-05-08 DIAGNOSIS — Z0189 Encounter for other specified special examinations: Secondary | ICD-10-CM | POA: Diagnosis not present

## 2018-05-08 DIAGNOSIS — E7870 Disorder of bile acid and cholesterol metabolism, unspecified: Secondary | ICD-10-CM | POA: Diagnosis not present

## 2018-06-22 DIAGNOSIS — H8113 Benign paroxysmal vertigo, bilateral: Secondary | ICD-10-CM | POA: Diagnosis not present

## 2018-06-22 DIAGNOSIS — E559 Vitamin D deficiency, unspecified: Secondary | ICD-10-CM | POA: Diagnosis not present

## 2018-06-22 DIAGNOSIS — D509 Iron deficiency anemia, unspecified: Secondary | ICD-10-CM | POA: Diagnosis not present

## 2018-06-22 DIAGNOSIS — R5382 Chronic fatigue, unspecified: Secondary | ICD-10-CM | POA: Diagnosis not present

## 2018-07-10 ENCOUNTER — Telehealth: Payer: Self-pay | Admitting: Oncology

## 2018-07-10 ENCOUNTER — Encounter: Payer: Self-pay | Admitting: Oncology

## 2018-07-10 NOTE — Telephone Encounter (Signed)
New referral received from Dr. Kateri PlummerMorrow for anemia. Pt has been cld and scheduled to see Dr. Clelia CroftShadad on 1/14 at 2:15pm. Pt agreed to the appt date and time.  Pt aware to arrive 30 minutes early. Letter mailed.

## 2018-07-20 DIAGNOSIS — K080 Exfoliation of teeth due to systemic causes: Secondary | ICD-10-CM | POA: Diagnosis not present

## 2018-08-03 ENCOUNTER — Telehealth: Payer: Self-pay | Admitting: Oncology

## 2018-08-03 ENCOUNTER — Encounter: Payer: Self-pay | Admitting: Oncology

## 2018-08-03 NOTE — Telephone Encounter (Signed)
Pt cld to reschedule new hem appt to 1/29 at 11am w/Dr. Clelia Croft. Aware to arrive 30 minutes early. New letter mailed.

## 2018-08-04 ENCOUNTER — Inpatient Hospital Stay: Payer: Federal, State, Local not specified - PPO | Admitting: Oncology

## 2018-08-19 ENCOUNTER — Inpatient Hospital Stay: Payer: Federal, State, Local not specified - PPO | Attending: Oncology | Admitting: Oncology

## 2018-08-20 ENCOUNTER — Telehealth: Payer: Self-pay | Admitting: Oncology

## 2018-08-20 NOTE — Telephone Encounter (Signed)
Lft the pt a vm to reschedule appt °

## 2018-09-07 ENCOUNTER — Ambulatory Visit: Payer: Self-pay | Admitting: Cardiology

## 2018-09-07 NOTE — Progress Notes (Deleted)
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  patient was seen by me in 04/2018. Given her very high cholesterol, family h/o first degree relative (father) with total chol >290, I suspected this to be familial hypercholesterolemia. Patient met 3 points on on [facility] diagnostic criteria. I had recommended starting the patient on PCSK-9 inhibitor in addition to statin. I also gave her some reading material. patient was unsure about starting PCSK0 inhibitor at last visit and wanted to think about it.  Patient is here for follow up visit to discuss this further.  

## 2018-09-22 NOTE — Progress Notes (Deleted)
Patient is here for follow up visit.  Subjective:   Alexa Erickson, female    DOB: 03-03-1981, 38 y.o.   MRN: [id number]   No chief complaint on file.   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  patient was seen by me in 04/2018. Given her very high cholesterol, family h/o first degree relative (father) with total chol >290, I suspected this to be familial hypercholesterolemia. Patient met 3 points on on [facility] diagnostic criteria. I had recommended starting the patient on PCSK-9 inhibitor in addition to statin. I also gave her some reading material. patient was unsure about starting PCSK0 inhibitor at last visit and wanted to think about it.  Patient is here for follow up visit to discuss this further.   *** Past Medical History:  Diagnosis Date  . Hypercholesterolemia     *** Past Surgical History:  Procedure Laterality Date  . APPENDECTOMY    . CESAREAN SECTION    . TONSILLECTOMY      *** Social History   Socioeconomic History  . Marital status: Married    Spouse name: Not on file  . Number of children: Not on file  . Years of education: Not on file  . Highest education level: Not on file  Occupational History  . Not on file  Social Needs  . Financial resource strain: Not on file  . Food insecurity:    Worry: Not on file    Inability: Not on file  . Transportation needs:    Medical: Not on file    Non-medical: Not on file  Tobacco Use  . Smoking status: Never Smoker  . Smokeless tobacco: Never Used  Substance and Sexual Activity  . Alcohol use: No  . Drug use: No  . Sexual activity: Not on file  Lifestyle  . Physical activity:    Days per week: Not on file    Minutes per session: Not on file  . Stress: Not on file  Relationships  . Social connections:    Talks on phone: Not on file    Gets together: Not on file    Attends religious service: Not on file    Active member of club or  organization: Not on file    Attends meetings of clubs or organizations: Not on file    Relationship status: Not on file  . Intimate partner violence:    Fear of current or ex partner: Not on file    Emotionally abused: Not on file    Physically abused: Not on file    Forced sexual activity: Not on file  Other Topics Concern  . Not on file  Social History Narrative  . Not on file    *** Current Outpatient Medications on File Prior to Visit  Medication Sig Dispense Refill  . amoxicillin (AMOXIL) 500 MG capsule Take 1 capsule (500 mg total) by mouth 3 (three) times daily. 21 capsule 0  . atorvastatin (LIPITOR) 40 MG tablet Take 40 mg by mouth daily.    . benzonatate (TESSALON) 200 MG capsule Take 1 capsule (200 mg total) by mouth 3 (three) times daily as needed for cough. 20 capsule 0  . ibuprofen (ADVIL,MOTRIN) 200 MG tablet Take 400 mg by mouth every 6 (six) hours as needed for headache or moderate pain.    . naproxen (NAPROSYN) 500 MG tablet Take 1 tablet (500 mg total) by mouth 2 (two) times daily. 30 tablet 0  . Norethin-Eth  Estrad-Fe Biphas (LO LOESTRIN FE PO) Take 1 tablet by mouth daily.     . Vitamin D, Ergocalciferol, (DRISDOL) 50000 units CAPS capsule Take 50,000 Units by mouth every 7 (seven) days.     No current facility-administered medications on file prior to visit.     Cardiovascular studies:  Labs 04/13/2018: Glucose 87, BUN/Cr 12/0.58. eGFR normal, Na/K 135/4.3. Rest of the CMP normal.  CHol 444, TG 162, HDL 38, LDL 374 TSH 2.0 normal  Review of Systems  Constitution: Negative for decreased appetite, malaise/fatigue, weight gain and weight loss.  HENT: Negative for congestion.   Eyes: Negative for visual disturbance.  Cardiovascular: Negative for chest pain, claudication, dyspnea on exertion, leg swelling, palpitations and syncope.  Respiratory: Negative for shortness of breath.   Endocrine: Negative for cold intolerance.  Hematologic/Lymphatic: Does not  bruise/bleed easily.  Skin: Negative for itching and rash.  Musculoskeletal: Negative for myalgias.  Gastrointestinal: Negative for abdominal pain, nausea and vomiting.  Genitourinary: Negative for dysuria.  Neurological: Negative for dizziness and weakness.  Psychiatric/Behavioral: The patient is not nervous/anxious.   All other systems reviewed and are negative.      Objective:   *** There were no vitals filed for this visit.   Physical Exam  Constitutional: She is oriented to person, place, and time. She appears well-developed and well-nourished. No distress.  HENT:  Head: Normocephalic and atraumatic.  Eyes: Pupils are equal, round, and reactive to light. Conjunctivae are normal.  Neck: No JVD present.  Cardiovascular: Normal rate, regular rhythm and intact distal pulses.  No murmur heard. Pulmonary/Chest: Effort normal and breath sounds normal. She has no wheezes. She has no rales.  Abdominal: Soft. Bowel sounds are normal. There is no rebound.  Musculoskeletal:        General: No edema.  Lymphadenopathy:    She has no cervical adenopathy.  Neurological: She is alert and oriented to person, place, and time. No cranial nerve deficit.  Skin: Skin is warm and dry.  Psychiatric: She has a normal mood and affect.  Nursing note and vitals reviewed.       Assessment & Recommendations:   38 y/o [place] female with familial hypercholestrolemia.  Familial hypercholesterolemia: ***  There are no diagnoses linked to this encounter.   Nigel Mormon, MD [facility] Pager: [phone] Office: [phone] If no answer Cell [phone]

## 2018-09-23 ENCOUNTER — Ambulatory Visit: Payer: Self-pay | Admitting: Cardiology

## 2018-09-24 ENCOUNTER — Telehealth: Payer: Self-pay | Admitting: Oncology

## 2018-09-24 NOTE — Telephone Encounter (Signed)
A new referral was sent from Dr. Kateri Plummer on 2/26. Pt has been scheduled for 1/14 to see Dr. Clelia Croft, but rescheduled her appt to 1/29. She was called back on 1/30 and a vm was lft. I hadn't received a response from the pt. I cld today and her phone went to vm.

## 2018-09-30 DIAGNOSIS — Z6830 Body mass index (BMI) 30.0-30.9, adult: Secondary | ICD-10-CM | POA: Diagnosis not present

## 2018-09-30 DIAGNOSIS — Z1389 Encounter for screening for other disorder: Secondary | ICD-10-CM | POA: Diagnosis not present

## 2018-09-30 DIAGNOSIS — Z01419 Encounter for gynecological examination (general) (routine) without abnormal findings: Secondary | ICD-10-CM | POA: Diagnosis not present

## 2018-09-30 DIAGNOSIS — Z130 Encounter for screening for diseases of the blood and blood-forming organs and certain disorders involving the immune mechanism: Secondary | ICD-10-CM | POA: Diagnosis not present

## 2018-10-26 ENCOUNTER — Ambulatory Visit: Payer: Self-pay | Admitting: Cardiology

## 2018-11-16 ENCOUNTER — Other Ambulatory Visit: Payer: Self-pay | Admitting: Cardiology

## 2018-11-23 ENCOUNTER — Other Ambulatory Visit: Payer: Self-pay

## 2018-11-23 NOTE — Telephone Encounter (Signed)
Please check if she could see me sooner. I am happy to refill, but need to follow up.

## 2018-12-07 ENCOUNTER — Ambulatory Visit: Payer: Self-pay | Admitting: Cardiology

## 2019-01-04 DIAGNOSIS — N912 Amenorrhea, unspecified: Secondary | ICD-10-CM | POA: Diagnosis not present

## 2019-01-04 DIAGNOSIS — Z3201 Encounter for pregnancy test, result positive: Secondary | ICD-10-CM | POA: Diagnosis not present

## 2019-01-26 DIAGNOSIS — Z3689 Encounter for other specified antenatal screening: Secondary | ICD-10-CM | POA: Diagnosis not present

## 2019-01-26 DIAGNOSIS — Z1379 Encounter for other screening for genetic and chromosomal anomalies: Secondary | ICD-10-CM | POA: Diagnosis not present

## 2019-01-26 DIAGNOSIS — Z113 Encounter for screening for infections with a predominantly sexual mode of transmission: Secondary | ICD-10-CM | POA: Diagnosis not present

## 2019-01-26 DIAGNOSIS — Z3A08 8 weeks gestation of pregnancy: Secondary | ICD-10-CM | POA: Diagnosis not present

## 2019-01-26 DIAGNOSIS — Z3682 Encounter for antenatal screening for nuchal translucency: Secondary | ICD-10-CM | POA: Diagnosis not present

## 2019-01-26 DIAGNOSIS — O26891 Other specified pregnancy related conditions, first trimester: Secondary | ICD-10-CM | POA: Diagnosis not present

## 2019-01-26 DIAGNOSIS — Z368A Encounter for antenatal screening for other genetic defects: Secondary | ICD-10-CM | POA: Diagnosis not present

## 2019-01-26 DIAGNOSIS — O09511 Supervision of elderly primigravida, first trimester: Secondary | ICD-10-CM | POA: Diagnosis not present

## 2019-01-26 LAB — OB RESULTS CONSOLE HIV ANTIBODY (ROUTINE TESTING): HIV: NONREACTIVE

## 2019-01-26 LAB — OB RESULTS CONSOLE ABO/RH: RH Type: POSITIVE

## 2019-01-26 LAB — OB RESULTS CONSOLE ANTIBODY SCREEN: Antibody Screen: NEGATIVE

## 2019-01-26 LAB — OB RESULTS CONSOLE GC/CHLAMYDIA
Chlamydia: NEGATIVE
Gonorrhea: NEGATIVE

## 2019-01-26 LAB — OB RESULTS CONSOLE RUBELLA ANTIBODY, IGM: Rubella: IMMUNE

## 2019-01-26 LAB — OB RESULTS CONSOLE HEPATITIS B SURFACE ANTIGEN: Hepatitis B Surface Ag: NEGATIVE

## 2019-01-26 LAB — OB RESULTS CONSOLE RPR: RPR: NONREACTIVE

## 2019-02-23 DIAGNOSIS — O09519 Supervision of elderly primigravida, unspecified trimester: Secondary | ICD-10-CM | POA: Diagnosis not present

## 2019-02-23 DIAGNOSIS — Z3A12 12 weeks gestation of pregnancy: Secondary | ICD-10-CM | POA: Diagnosis not present

## 2019-02-23 DIAGNOSIS — Z3682 Encounter for antenatal screening for nuchal translucency: Secondary | ICD-10-CM | POA: Diagnosis not present

## 2019-04-08 DIAGNOSIS — O09519 Supervision of elderly primigravida, unspecified trimester: Secondary | ICD-10-CM | POA: Diagnosis not present

## 2019-04-08 DIAGNOSIS — Z3A18 18 weeks gestation of pregnancy: Secondary | ICD-10-CM | POA: Diagnosis not present

## 2019-04-08 DIAGNOSIS — Z363 Encounter for antenatal screening for malformations: Secondary | ICD-10-CM | POA: Diagnosis not present

## 2019-05-21 DIAGNOSIS — R3915 Urgency of urination: Secondary | ICD-10-CM | POA: Diagnosis not present

## 2019-06-08 DIAGNOSIS — Z3689 Encounter for other specified antenatal screening: Secondary | ICD-10-CM | POA: Diagnosis not present

## 2019-06-08 DIAGNOSIS — Z23 Encounter for immunization: Secondary | ICD-10-CM | POA: Diagnosis not present

## 2019-06-08 DIAGNOSIS — Z3A27 27 weeks gestation of pregnancy: Secondary | ICD-10-CM | POA: Diagnosis not present

## 2019-06-16 DIAGNOSIS — O289 Unspecified abnormal findings on antenatal screening of mother: Secondary | ICD-10-CM | POA: Diagnosis not present

## 2019-06-23 DIAGNOSIS — O24419 Gestational diabetes mellitus in pregnancy, unspecified control: Secondary | ICD-10-CM | POA: Diagnosis not present

## 2019-06-30 ENCOUNTER — Other Ambulatory Visit: Payer: Self-pay

## 2019-06-30 ENCOUNTER — Encounter: Payer: Self-pay | Admitting: Registered"

## 2019-06-30 ENCOUNTER — Encounter: Payer: Federal, State, Local not specified - PPO | Attending: Obstetrics and Gynecology | Admitting: Registered"

## 2019-06-30 DIAGNOSIS — O99810 Abnormal glucose complicating pregnancy: Secondary | ICD-10-CM | POA: Diagnosis not present

## 2019-06-30 NOTE — Progress Notes (Signed)

## 2019-07-29 DIAGNOSIS — O24415 Gestational diabetes mellitus in pregnancy, controlled by oral hypoglycemic drugs: Secondary | ICD-10-CM | POA: Diagnosis not present

## 2019-07-29 DIAGNOSIS — Z3A34 34 weeks gestation of pregnancy: Secondary | ICD-10-CM | POA: Diagnosis not present

## 2019-08-03 DIAGNOSIS — Z3A35 35 weeks gestation of pregnancy: Secondary | ICD-10-CM | POA: Diagnosis not present

## 2019-08-03 DIAGNOSIS — O24419 Gestational diabetes mellitus in pregnancy, unspecified control: Secondary | ICD-10-CM | POA: Diagnosis not present

## 2019-08-06 DIAGNOSIS — O24415 Gestational diabetes mellitus in pregnancy, controlled by oral hypoglycemic drugs: Secondary | ICD-10-CM | POA: Diagnosis not present

## 2019-08-06 DIAGNOSIS — Z3A36 36 weeks gestation of pregnancy: Secondary | ICD-10-CM | POA: Diagnosis not present

## 2019-08-06 DIAGNOSIS — Z3685 Encounter for antenatal screening for Streptococcus B: Secondary | ICD-10-CM | POA: Diagnosis not present

## 2019-08-06 LAB — OB RESULTS CONSOLE GBS: GBS: NEGATIVE

## 2019-08-10 DIAGNOSIS — O24415 Gestational diabetes mellitus in pregnancy, controlled by oral hypoglycemic drugs: Secondary | ICD-10-CM | POA: Diagnosis not present

## 2019-08-10 DIAGNOSIS — Z3A36 36 weeks gestation of pregnancy: Secondary | ICD-10-CM | POA: Diagnosis not present

## 2019-08-13 DIAGNOSIS — O24410 Gestational diabetes mellitus in pregnancy, diet controlled: Secondary | ICD-10-CM | POA: Diagnosis not present

## 2019-08-13 DIAGNOSIS — Z3A37 37 weeks gestation of pregnancy: Secondary | ICD-10-CM | POA: Diagnosis not present

## 2019-08-18 ENCOUNTER — Telehealth (HOSPITAL_COMMUNITY): Payer: Self-pay | Admitting: *Deleted

## 2019-08-18 ENCOUNTER — Encounter (HOSPITAL_COMMUNITY): Payer: Self-pay | Admitting: *Deleted

## 2019-08-18 NOTE — Patient Instructions (Signed)
Anella Jonella Redditt  08/18/2019   Your procedure is scheduled on:  08/27/2019  Arrive at 0530 at Entrance C on [address] at [facility]  and [address]. You are invited to use the FREE valet parking or use the Visitor's parking deck.  Pick up the phone at the desk and dial [phone].  Call this number if you have problems the morning of surgery: [phone]  Remember:   Do not eat food:(After Midnight) Desps de medianoche.  Do not drink clear liquids: (After Midnight) Desps de medianoche.  Take these medicines the morning of surgery with A SIP OF WATER:  none   Do not wear jewelry, make-up or nail polish.  Do not wear lotions, powders, or perfumes. Do not wear deodorant.  Do not shave 48 hours prior to surgery.  Do not bring valuables to the hospital.  [facility] is not   responsible for any belongings or valuables brought to the hospital.  Contacts, dentures or bridgework may not be worn into surgery.  Leave suitcase in the car. After surgery it may be brought to your room.  For patients admitted to the hospital, checkout time is 11:00 AM the day of              discharge.      Please read over the following fact sheets that you were given:     Preparing for Surgery

## 2019-08-18 NOTE — Telephone Encounter (Signed)
Preadmission screen  

## 2019-08-19 ENCOUNTER — Encounter (HOSPITAL_COMMUNITY): Payer: Self-pay

## 2019-08-20 DIAGNOSIS — O24410 Gestational diabetes mellitus in pregnancy, diet controlled: Secondary | ICD-10-CM | POA: Diagnosis not present

## 2019-08-20 DIAGNOSIS — Z3A38 38 weeks gestation of pregnancy: Secondary | ICD-10-CM | POA: Diagnosis not present

## 2019-08-24 DIAGNOSIS — O24410 Gestational diabetes mellitus in pregnancy, diet controlled: Secondary | ICD-10-CM | POA: Diagnosis not present

## 2019-08-24 DIAGNOSIS — Z3A38 38 weeks gestation of pregnancy: Secondary | ICD-10-CM | POA: Diagnosis not present

## 2019-08-25 ENCOUNTER — Other Ambulatory Visit: Payer: Self-pay

## 2019-08-25 ENCOUNTER — Other Ambulatory Visit (HOSPITAL_COMMUNITY)
Admission: RE | Admit: 2019-08-25 | Discharge: 2019-08-25 | Disposition: A | Discharge status: Home / Self Care | Payer: Federal, State, Local not specified - PPO | Source: Ambulatory Visit | Attending: Obstetrics and Gynecology | Admitting: Obstetrics and Gynecology

## 2019-08-25 DIAGNOSIS — Z20822 Contact with and (suspected) exposure to covid-19: Secondary | ICD-10-CM | POA: Diagnosis not present

## 2019-08-25 HISTORY — DX: Gestational diabetes mellitus in pregnancy, unspecified control: O24.419

## 2019-08-25 LAB — CBC
HCT: 32.8 % — ABNORMAL LOW (ref 36.0–46.0)
Hemoglobin: 10.2 g/dL — ABNORMAL LOW (ref 12.0–15.0)
MCH: 26.7 pg (ref 26.0–34.0)
MCHC: 31.1 g/dL (ref 30.0–36.0)
MCV: 85.9 fL (ref 80.0–100.0)
Platelets: 142 10*3/uL — ABNORMAL LOW (ref 150–400)
RBC: 3.82 MIL/uL — ABNORMAL LOW (ref 3.87–5.11)
RDW: 14.7 % (ref 11.5–15.5)
WBC: 6 10*3/uL (ref 4.0–10.5)
nRBC: 0 % (ref 0.0–0.2)

## 2019-08-25 LAB — RPR: RPR Ser Ql: NONREACTIVE

## 2019-08-25 LAB — TYPE AND SCREEN
ABO/RH(D): O POS
Antibody Screen: NEGATIVE

## 2019-08-25 LAB — ABO/RH: ABO/RH(D): O POS

## 2019-08-25 LAB — SARS CORONAVIRUS 2 (TAT 6-24 HRS): SARS Coronavirus 2: NEGATIVE

## 2019-08-25 NOTE — MAU Note (Signed)
Pt is here for covid swab and is asymptomatic.

## 2019-08-26 NOTE — Anesthesia Preprocedure Evaluation (Addendum)
Anesthesia Evaluation  Patient identified by MRN, date of birth, ID band Patient awake    Reviewed: Allergy & Precautions, H&P , NPO status , Patient's Chart, lab work & pertinent test results  Airway Mallampati: II  TM Distance: >3 FB Neck ROM: Full    Dental no notable dental hx. (+) Teeth Intact, Dental Advisory Given   Pulmonary neg pulmonary ROS,    Pulmonary exam normal breath sounds clear to auscultation       Cardiovascular Exercise Tolerance: Good negative cardio ROS Normal cardiovascular exam Rhythm:Regular Rate:Normal     Neuro/Psych negative neurological ROS  negative psych ROS   GI/Hepatic negative GI ROS, Neg liver ROS,   Endo/Other  negative endocrine ROSdiabetes, GestationalMorbid obesity  Renal/GU negative Renal ROS  negative genitourinary   Musculoskeletal negative musculoskeletal ROS (+)   Abdominal   Peds negative pediatric ROS (+)  Hematology negative hematology ROS (+) Blood dyscrasia, anemia ,   Anesthesia Other Findings   Reproductive/Obstetrics negative OB ROS                            Anesthesia Physical Anesthesia Plan  ASA: III  Anesthesia Plan: Spinal   Post-op Pain Management:    Induction:   PONV Risk Score and Plan: Scopolamine patch - Pre-op and Treatment may vary due to age or medical condition  Airway Management Planned: Nasal Cannula, Simple Face Mask and Mask  Additional Equipment:   Intra-op Plan:   Post-operative Plan:   Informed Consent: I have reviewed the patients History and Physical, chart, labs and discussed the procedure including the risks, benefits and alternatives for the proposed anesthesia with the patient or authorized representative who has indicated his/her understanding and acceptance.       Plan Discussed with: Anesthesiologist  Anesthesia Plan Comments: (  )        Anesthesia Quick Evaluation

## 2019-08-26 NOTE — H&P (Signed)
Alexa Erickson is a 39 y.o. female at 34 0/7 weeks (EDD 09/03/19 by 8 week [place] inconsistent with LMP) presenting for scheduled repeat c-section, declining trial of labor. Prenatal care significant for AMA with a low risk panorama.  She also had GDM well-controlled on diet.  She had a prior c-section for abruption and was offered VBAC, but declined.   OB History    Gravida  4   Para  3   Term      Preterm      AB      Living        SAB      TAB      Ectopic      Multiple      Live Births            10-07-2002, 41 wks  F, 8lbs 2oz, Vaginal Delivery 03-06-2006, 40 wks  M, 8lbs 5oz, Vaginal Delivery 06-27-2010, 39 wks  M, 8lbs, Cesarean Section Past Medical History:  Diagnosis Date  . Gestational diabetes   . Hypercholesterolemia    Past Surgical History:  Procedure Laterality Date  . APPENDECTOMY    . CESAREAN SECTION    . 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 TONSILLECTOMY     Family History: family history is not on file. Social History:  reports that she has never smoked. She has never used smokeless tobacco. She reports that she does not drink alcohol or use drugs.     Maternal Diabetes: No Genetic Screening: Normal Maternal Ultrasounds/Referrals: Normal Fetal Ultrasounds or other Referrals:  None Maternal Substance Abuse:  No Significant Maternal Medications:  None Significant Maternal Lab Results:  None Other Comments:  None  Review of Systems  Constitutional: Negative for fever.  Gastrointestinal: Negative for abdominal pain.   Maternal Medical History:  Contractions: Frequency: irregular.   Perceived severity is mild.    Fetal activity: Perceived fetal activity is normal.    Prenatal complications: AMA, GDM, Prior c-section  Prenatal Complications - Diabetes: gestational. Diabetes is managed by diet.        Last menstrual period 11/20/2018. Maternal Exam:  Uterine Assessment: Contraction strength is mild.  Contraction  frequency is irregular.   Abdomen: Patient reports no abdominal tenderness. Surgical scars: low transverse.   Introitus: Normal vulva. Normal vagina.    Physical Exam  Constitutional: She appears well-developed.  Cardiovascular: Normal rate and regular rhythm.  Respiratory: Effort normal.  GI: Soft.  Genitourinary:    Vulva, vagina and uterus normal.   Neurological: She is alert.  Psychiatric: She has a normal mood and affect.    Prenatal labs: ABO, Rh: --/--/O POS, O POS Performed at [facility], [address] [place], [place] [address]  [phone]) Antibody: NEG (02/03 0847) Rubella: Immune (07/07 0000) RPR: NON REACTIVE (02/03 0847)  HBsAg: Negative (07/07 0000)  HIV: Non-reactive (07/07 0000)  GBS: Negative/-- (01/15 0000)  One hour GCT 171 3 hour GTT abnormal x 2 values Essential panel negative  Assessment/Plan:  d/w pt c-section process in detail.  Reviewed risks and benefits including blood transfusion, infection and possible damage to bowel and bladder.  Pt ready to proceed.   Oliver Pila 08/26/2019, 12:58 PM

## 2019-08-27 ENCOUNTER — Other Ambulatory Visit: Payer: Self-pay

## 2019-08-27 ENCOUNTER — Inpatient Hospital Stay (HOSPITAL_COMMUNITY): Payer: Federal, State, Local not specified - PPO | Admitting: Anesthesiology

## 2019-08-27 ENCOUNTER — Inpatient Hospital Stay (HOSPITAL_COMMUNITY)
Admission: RE | Admit: 2019-08-27 | Discharge: 2019-08-29 | DRG: 788 | Disposition: A | Discharge status: Home / Self Care | Payer: Federal, State, Local not specified - PPO | Attending: Obstetrics and Gynecology | Admitting: Obstetrics and Gynecology

## 2019-08-27 ENCOUNTER — Encounter (HOSPITAL_COMMUNITY): Payer: Self-pay | Admitting: Obstetrics and Gynecology

## 2019-08-27 ENCOUNTER — Encounter (HOSPITAL_COMMUNITY)
Admission: RE | Disposition: A | Discharge status: Home / Self Care | Payer: Self-pay | Source: Home / Self Care | Attending: Obstetrics and Gynecology

## 2019-08-27 DIAGNOSIS — D649 Anemia, unspecified: Secondary | ICD-10-CM | POA: Diagnosis not present

## 2019-08-27 DIAGNOSIS — O34219 Maternal care for unspecified type scar from previous cesarean delivery: Secondary | ICD-10-CM | POA: Diagnosis not present

## 2019-08-27 DIAGNOSIS — Z3A39 39 weeks gestation of pregnancy: Secondary | ICD-10-CM

## 2019-08-27 DIAGNOSIS — O34211 Maternal care for low transverse scar from previous cesarean delivery: Principal | ICD-10-CM | POA: Diagnosis present

## 2019-08-27 DIAGNOSIS — O99214 Obesity complicating childbirth: Secondary | ICD-10-CM | POA: Diagnosis present

## 2019-08-27 DIAGNOSIS — O24420 Gestational diabetes mellitus in childbirth, diet controlled: Secondary | ICD-10-CM | POA: Diagnosis present

## 2019-08-27 DIAGNOSIS — Z98891 History of uterine scar from previous surgery: Secondary | ICD-10-CM

## 2019-08-27 DIAGNOSIS — O9902 Anemia complicating childbirth: Secondary | ICD-10-CM | POA: Diagnosis not present

## 2019-08-27 DIAGNOSIS — Z0542 Observation and evaluation of newborn for suspected metabolic condition ruled out: Secondary | ICD-10-CM | POA: Diagnosis not present

## 2019-08-27 DIAGNOSIS — Z23 Encounter for immunization: Secondary | ICD-10-CM | POA: Diagnosis not present

## 2019-08-27 LAB — GLUCOSE, CAPILLARY
Glucose-Capillary: 57 mg/dL — ABNORMAL LOW (ref 70–99)
Glucose-Capillary: 107 mg/dL — ABNORMAL HIGH (ref 70–99)
Glucose-Capillary: 88 mg/dL (ref 70–99)

## 2019-08-27 SURGERY — Surgical Case
Anesthesia: Spinal | Site: Abdomen | Wound class: Clean Contaminated

## 2019-08-27 MED ORDER — SENNOSIDES-DOCUSATE SODIUM 8.6-50 MG PO TABS
2.0000 | ORAL_TABLET | ORAL | Status: DC
  Administered 2019-08-27 – 2019-08-29 (×2): 2 via ORAL
  Filled 2019-08-27 (×2): qty 2

## 2019-08-27 MED ORDER — ONDANSETRON HCL 4 MG/2ML IJ SOLN
INTRAMUSCULAR | Status: DC | PRN
  Administered 2019-08-27: 4 mg via INTRAVENOUS

## 2019-08-27 MED ORDER — ZOLPIDEM TARTRATE 5 MG PO TABS: 5.0000 mg | ORAL_TABLET | Freq: Every evening | ORAL | Status: DC | PRN

## 2019-08-27 MED ORDER — MEPERIDINE HCL 25 MG/ML IJ SOLN: 6.2500 mg | INTRAMUSCULAR | Status: DC | PRN

## 2019-08-27 MED ORDER — CEFAZOLIN SODIUM-DEXTROSE 2-4 GM/100ML-% IV SOLN: 2.0000 g | INTRAVENOUS | Status: DC

## 2019-08-27 MED ORDER — FENTANYL CITRATE (PF) 100 MCG/2ML IJ SOLN
INTRAMUSCULAR | Status: AC
  Filled 2019-08-27: qty 2

## 2019-08-27 MED ORDER — CEFAZOLIN SODIUM-DEXTROSE 2-3 GM-%(50ML) IV SOLR
INTRAVENOUS | Status: DC | PRN
  Administered 2019-08-27: 2 g via INTRAVENOUS

## 2019-08-27 MED ORDER — FENTANYL CITRATE (PF) 100 MCG/2ML IJ SOLN
INTRAMUSCULAR | Status: DC | PRN
  Administered 2019-08-27: 15 ug via INTRAVENOUS

## 2019-08-27 MED ORDER — PHENYLEPHRINE HCL-NACL 20-0.9 MG/250ML-% IV SOLN
INTRAVENOUS | Status: DC | PRN
  Administered 2019-08-27: 60 ug/min via INTRAVENOUS

## 2019-08-27 MED ORDER — OXYTOCIN 10 UNIT/ML IJ SOLN
INTRAMUSCULAR | Status: DC | PRN
  Administered 2019-08-27: 40 [IU] via INTRAMUSCULAR

## 2019-08-27 MED ORDER — PRENATAL MULTIVITAMIN CH
1.0000 | ORAL_TABLET | Freq: Every day | ORAL | Status: DC
  Administered 2019-08-27 – 2019-08-29 (×3): 1 via ORAL
  Filled 2019-08-27 (×3): qty 1

## 2019-08-27 MED ORDER — LACTATED RINGERS IV SOLN: INTRAVENOUS | Status: DC | PRN

## 2019-08-27 MED ORDER — ACETAMINOPHEN 325 MG PO TABS: 325.0000 mg | ORAL_TABLET | ORAL | Status: DC | PRN

## 2019-08-27 MED ORDER — WITCH HAZEL-GLYCERIN EX PADS: 1.0000 "application " | MEDICATED_PAD | CUTANEOUS | Status: DC | PRN

## 2019-08-27 MED ORDER — OXYCODONE HCL 5 MG/5ML PO SOLN: 5.0000 mg | Freq: Once | ORAL | Status: DC | PRN

## 2019-08-27 MED ORDER — ONDANSETRON HCL 4 MG/2ML IJ SOLN: 4.0000 mg | Freq: Once | INTRAMUSCULAR | Status: DC | PRN

## 2019-08-27 MED ORDER — MORPHINE SULFATE (PF) 0.5 MG/ML IJ SOLN
INTRAMUSCULAR | Status: AC
  Filled 2019-08-27: qty 10

## 2019-08-27 MED ORDER — OXYCODONE HCL 5 MG PO TABS: 5.0000 mg | ORAL_TABLET | ORAL | Status: DC | PRN

## 2019-08-27 MED ORDER — COCONUT OIL OIL: 1.0000 "application " | TOPICAL_OIL | Status: DC | PRN

## 2019-08-27 MED ORDER — SIMETHICONE 80 MG PO CHEW
80.0000 mg | CHEWABLE_TABLET | Freq: Three times a day (TID) | ORAL | Status: DC
  Administered 2019-08-27 – 2019-08-29 (×6): 80 mg via ORAL
  Filled 2019-08-27 (×7): qty 1

## 2019-08-27 MED ORDER — SODIUM CHLORIDE 0.9 % IR SOLN
Status: DC | PRN
  Administered 2019-08-27: 1000 mL

## 2019-08-27 MED ORDER — OXYTOCIN 40 UNITS IN NORMAL SALINE INFUSION - SIMPLE MED
INTRAVENOUS | Status: AC
  Filled 2019-08-27: qty 1000

## 2019-08-27 MED ORDER — OXYCODONE HCL 5 MG PO TABS: 5.0000 mg | ORAL_TABLET | Freq: Once | ORAL | Status: DC | PRN

## 2019-08-27 MED ORDER — OXYTOCIN 40 UNITS IN NORMAL SALINE INFUSION - SIMPLE MED: 2.5000 [IU]/h | INTRAVENOUS | Status: AC

## 2019-08-27 MED ORDER — ACETAMINOPHEN 325 MG PO TABS
650.0000 mg | ORAL_TABLET | ORAL | Status: DC | PRN
  Administered 2019-08-28 – 2019-08-29 (×3): 650 mg via ORAL
  Filled 2019-08-27 (×3): qty 2

## 2019-08-27 MED ORDER — FENTANYL CITRATE (PF) 100 MCG/2ML IJ SOLN
25.0000 ug | INTRAMUSCULAR | Status: DC | PRN
  Administered 2019-08-27: 50 ug via INTRAVENOUS

## 2019-08-27 MED ORDER — PHENYLEPHRINE HCL-NACL 20-0.9 MG/250ML-% IV SOLN
INTRAVENOUS | Status: AC
  Filled 2019-08-27: qty 250

## 2019-08-27 MED ORDER — DIPHENHYDRAMINE HCL 25 MG PO CAPS: 25.0000 mg | ORAL_CAPSULE | Freq: Four times a day (QID) | ORAL | Status: DC | PRN

## 2019-08-27 MED ORDER — MORPHINE SULFATE (PF) 0.5 MG/ML IJ SOLN
INTRAMUSCULAR | Status: DC | PRN
  Administered 2019-08-27: .15 ug via EPIDURAL

## 2019-08-27 MED ORDER — LACTATED RINGERS IV BOLUS
500.0000 mL | Freq: Once | INTRAVENOUS | Status: AC
  Administered 2019-08-27: 500 mL via INTRAVENOUS

## 2019-08-27 MED ORDER — TETANUS-DIPHTH-ACELL PERTUSSIS 5-2.5-18.5 LF-MCG/0.5 IM SUSP: 0.5000 mL | Freq: Once | INTRAMUSCULAR | Status: DC

## 2019-08-27 MED ORDER — LACTATED RINGERS IV SOLN: INTRAVENOUS | Status: DC

## 2019-08-27 MED ORDER — MENTHOL 3 MG MT LOZG: 1.0000 | LOZENGE | OROMUCOSAL | Status: DC | PRN

## 2019-08-27 MED ORDER — DEXTROSE 50 % IV SOLN
INTRAVENOUS | Status: AC
  Filled 2019-08-27: qty 50

## 2019-08-27 MED ORDER — CEFAZOLIN SODIUM-DEXTROSE 2-4 GM/100ML-% IV SOLN
INTRAVENOUS | Status: AC
  Filled 2019-08-27: qty 100

## 2019-08-27 MED ORDER — STERILE WATER FOR IRRIGATION IR SOLN
Status: DC | PRN
  Administered 2019-08-27: 1000 mL

## 2019-08-27 MED ORDER — IBUPROFEN 800 MG PO TABS
800.0000 mg | ORAL_TABLET | Freq: Three times a day (TID) | ORAL | Status: DC
  Administered 2019-08-27 – 2019-08-29 (×6): 800 mg via ORAL
  Filled 2019-08-27 (×6): qty 1

## 2019-08-27 MED ORDER — SIMETHICONE 80 MG PO CHEW
80.0000 mg | CHEWABLE_TABLET | ORAL | Status: DC
  Administered 2019-08-27 – 2019-08-29 (×2): 80 mg via ORAL
  Filled 2019-08-27 (×2): qty 1

## 2019-08-27 MED ORDER — PHENYLEPHRINE HCL (PRESSORS) 10 MG/ML IV SOLN
INTRAVENOUS | Status: DC | PRN
  Administered 2019-08-27 (×4): 80 ug via INTRAVENOUS

## 2019-08-27 MED ORDER — SODIUM CHLORIDE 0.9 % IV SOLN: INTRAVENOUS | Status: DC | PRN

## 2019-08-27 MED ORDER — SIMETHICONE 80 MG PO CHEW: 80.0000 mg | CHEWABLE_TABLET | ORAL | Status: DC | PRN

## 2019-08-27 MED ORDER — ACETAMINOPHEN 160 MG/5ML PO SOLN: 325.0000 mg | ORAL | Status: DC | PRN

## 2019-08-27 MED ORDER — DIBUCAINE (PERIANAL) 1 % EX OINT: 1.0000 "application " | TOPICAL_OINTMENT | CUTANEOUS | Status: DC | PRN

## 2019-08-27 MED ORDER — DEXTROSE 50 % IV SOLN
12.5000 g | INTRAVENOUS | Status: AC
  Administered 2019-08-27: 07:00:00 12.5 g via INTRAVENOUS

## 2019-08-27 MED ORDER — ONDANSETRON HCL 4 MG/2ML IJ SOLN
INTRAMUSCULAR | Status: AC
  Filled 2019-08-27: qty 2

## 2019-08-27 SURGICAL SUPPLY — 34 items
BENZOIN TINCTURE PRP APPL 2/3 (GAUZE/BANDAGES/DRESSINGS) ×4 IMPLANT
CHLORAPREP W/TINT 26ML (MISCELLANEOUS) ×2 IMPLANT
CLAMP CORD UMBIL (MISCELLANEOUS) IMPLANT
CLOSURE STERI STRIP 1/2 X4 (GAUZE/BANDAGES/DRESSINGS) ×2 IMPLANT
CLOTH BEACON ORANGE TIMEOUT ST (SAFETY) ×2 IMPLANT
DRSG OPSITE POSTOP 4X10 (GAUZE/BANDAGES/DRESSINGS) ×2 IMPLANT
ELECT REM PT RETURN 9FT ADLT (ELECTROSURGICAL) ×2
ELECTRODE REM PT RTRN 9FT ADLT (ELECTROSURGICAL) ×1 IMPLANT
EXTRACTOR VACUUM KIWI (MISCELLANEOUS) IMPLANT
GLOVE BIO SURGEON STRL SZ 6.5 (GLOVE) ×2 IMPLANT
GLOVE BIOGEL PI IND STRL 7.0 (GLOVE) ×1 IMPLANT
GLOVE BIOGEL PI INDICATOR 7.0 (GLOVE) ×1
GOWN STRL REUS W/TWL LRG LVL3 (GOWN DISPOSABLE) ×4 IMPLANT
KIT ABG SYR 3ML LUER SLIP (SYRINGE) IMPLANT
NEEDLE HYPO 25X5/8 SAFETYGLIDE (NEEDLE) IMPLANT
NS IRRIG 1000ML POUR BTL (IV SOLUTION) ×2 IMPLANT
PACK C SECTION WH (CUSTOM PROCEDURE TRAY) ×2 IMPLANT
PAD OB MATERNITY 4.3X12.25 (PERSONAL CARE ITEMS) ×2 IMPLANT
PENCIL SMOKE EVAC W/HOLSTER (ELECTROSURGICAL) ×2 IMPLANT
RTRCTR C-SECT PINK 25CM LRG (MISCELLANEOUS) ×2 IMPLANT
STRIP CLOSURE SKIN 1/2X4 (GAUZE/BANDAGES/DRESSINGS) ×2 IMPLANT
SUT CHROMIC 1 CTX 36 (SUTURE) ×4 IMPLANT
SUT PLAIN 0 NONE (SUTURE) IMPLANT
SUT PLAIN 2 0 XLH (SUTURE) ×2 IMPLANT
SUT VIC AB 0 CT1 27 (SUTURE) ×2
SUT VIC AB 0 CT1 27XBRD ANBCTR (SUTURE) ×2 IMPLANT
SUT VIC AB 2-0 CT1 27 (SUTURE) ×1
SUT VIC AB 2-0 CT1 TAPERPNT 27 (SUTURE) ×1 IMPLANT
SUT VIC AB 3-0 CT1 27 (SUTURE)
SUT VIC AB 3-0 CT1 TAPERPNT 27 (SUTURE) IMPLANT
SUT VIC AB 4-0 KS 27 (SUTURE) ×4 IMPLANT
TOWEL OR 17X24 6PK STRL BLUE (TOWEL DISPOSABLE) ×2 IMPLANT
TRAY FOLEY W/BAG SLVR 14FR LF (SET/KITS/TRAYS/PACK) ×2 IMPLANT
WATER STERILE IRR 1000ML POUR (IV SOLUTION) ×2 IMPLANT

## 2019-08-27 NOTE — Lactation Note (Signed)
This note was copied from a baby's chart. Lactation Consultation Note  Patient Name: Alexa Erickson Today's Date: 08/27/2019 Reason for consult: Initial assessment;Term  P4 mother whose infant is now 67 hours old.  Mother breast fed her other three children for approximately one year each.  The youngest child is now 38 years old.  Mother's feeding preference is breast/bottle.  Baby was asleep in mother's arms when I arrived.  Mother had recently attempted to breast and bottle feed baby a half hour ago, however, she was too sleepy.  Reassured mother that this is typical behavior for a baby at this age.  Encouraged to feed 8-12 times/24 hours or sooner if baby shows cues.  Mother is familiar with hand expression.  Colostrum container provided and milk storage times reviewed.  Finger feeding demonstrated.  Mother was inquiring about breast milk storage; showed her the "milk storage table" in her Mother/Baby booklet.  Encouraged her to call her RN/LC for latch assistance as needed.  Mom made aware of O/P services, breastfeeding support groups, community resources, and our phone # for post-discharge questions. Mother has a DEBP for home use.  She will return to work in 12 weeks and we discussed pumping and storage of milk.  No support person present at this time.   Maternal Data Formula Feeding for Exclusion: Yes Reason for exclusion: Mother's choice to formula and breast feed on admission Has patient been taught Hand Expression?: Yes Does the patient have breastfeeding experience prior to this delivery?: Yes  Feeding Feeding Type: Formula Nipple Type: Slow - flow  LATCH Score                   Interventions    Lactation Tools Discussed/Used     Consult Status Consult Status: Follow-up Date: 08/28/19 Follow-up type: In-patient    Damontay Alred R Maryna Yeagle 08/27/2019, 6:05 PM

## 2019-08-27 NOTE — Discharge Summary (Signed)
OB Discharge Summary     Patient Name: Alexa Erickson DOB: 04/19/1981 MRN: [id number]  Date of admission: 08/27/2019 Delivering MD: Huel Cote   Date of discharge: 08/29/2019  Admitting diagnosis: Status post repeat low transverse cesarean section [Z98.891] Intrauterine pregnancy: [redacted]w[redacted]d     Secondary diagnosis:  Active Problems:   Status post repeat low transverse cesarean section  Additional problems: Gestational Diabetes, diet-controlled     Discharge diagnosis: Term Pregnancy Delivered                                                                                                Post partum procedures:none  Complications: None  Hospital course:  Sceduled C/S   39 y.o. yo G4P1001 at [redacted]w[redacted]d was admitted to the hospital 08/27/2019 for scheduled cesarean section with the following indication:Elective Repeat.  Membrane Rupture Time/Date: 7:55 AM ,08/27/2019   Patient delivered a Viable infant.08/27/2019  Details of operation can be found in separate operative note.  Pateint had an uncomplicated postpartum course.  She is ambulating, tolerating a regular diet, passing flatus, and urinating well. Patient is discharged home in stable condition on  08/29/19         Physical exam  Vitals:   08/28/19 0348 08/28/19 1409 08/28/19 2043 08/29/19 0523  BP: 120/65 124/77 134/85 140/83  Pulse: 80 69 71 61  Resp:  17 17 18   Temp: 98.2 F (36.8 C) 97.7 F (36.5 C) 99.2 F (37.3 C) 98.6 F (37 C)  TempSrc: Oral Axillary Axillary Oral  SpO2: 95% 100% 100% 100%  Weight:      Height:       General: alert and cooperative Lochia: appropriate Uterine Fundus: firm Incision: Dressing is clean, dry, and intact  Labs: Lab Results  Component Value Date   WBC 7.1 08/28/2019   HGB 8.7 (L) 08/28/2019   HCT 28.1 (L) 08/28/2019   MCV 86.7 08/28/2019   PLT 144 (L) 08/28/2019   No flowsheet data found.  Discharge instruction: per After Visit Summary and "Baby and Me  Booklet".  After visit meds:  Allergies as of 08/29/2019   No Known Allergies     Medication List    TAKE these medications   acetaminophen 325 MG tablet Commonly known as: TYLENOL Take 2 tablets (650 mg total) by mouth every 4 (four) hours as needed for mild pain (temperature > 101.5.).   ferrous sulfate 325 (65 FE) MG tablet Take 325 mg by mouth daily with breakfast.   ibuprofen 800 MG tablet Commonly known as: ADVIL Take 1 tablet (800 mg total) by mouth every 8 (eight) hours.   prenatal multivitamin Tabs tablet Take 1 tablet by mouth daily at 12 noon.       Diet: routine diet  Activity: Advance as tolerated. Pelvic rest for 6 weeks.   Outpatient follow up:2 weeks Follow up Appt:No future appointments. Follow up Visit:No follow-ups on file.  Postpartum contraception: Undecided  Newborn Data: Live born female  Birth Weight: 6 lb 5.6 oz (2880 g) APGAR: 8, 9  Newborn Delivery   Birth date/time: 08/27/2019 07:55:00 Delivery type: C-Section,  Low Transverse Trial of labor: No C-section categorization: Repeat      Baby Feeding: Bottle Disposition:home with mother   08/29/2019 Logan Bores, MD

## 2019-08-27 NOTE — Op Note (Signed)
Operative Note    Preoperative Diagnosis Term pregnancy at 39 0/7 weeks Prior c-section, declines trial of labor Gestational diabetes, diet-controlled  Postoperative Diagnosis same  Procedure Repeat low transverse section with 2 layer closure of uterus  Surgeon Huel Cote MD  Anesthesia Spinal  Fluids: EBL UOP clear IVF LR  Findings Viable female infant in vertex presentation Apgars 8,9.  Weight pending.  True knot x 2 in cord.  Normal uterus ovaries and tubes.  Specimen Placenta  Procedure Note Patient was taken to the operating room where spinal anesthesia was obtained and found to be adequate by Allis clamp test. She was prepped and draped in the normal sterile fashion in the dorsal supine position with a leftward tilt. An appropriate time out was performed. A Pfannenstiel skin incision was then made through a pre-existing scar with the scalpel and carried through to the underlying layer of fascia by sharp dissection and Bovie cautery. The fascia was nicked in the midline and the incision was extended laterally with Mayo scissors. The inferior aspect of the incision was grasped Coker clamps and dissected off the underlying rectus muscles. In a similar fashion the superior aspect was dissected off the rectus muscles. Rectus muscles were separated in the midline and the peritoneal cavity entered bluntly. The peritoneal incision was then extended both superiorly and inferiorly with careful attention to avoid both bowel and bladder. The Alexis self-retaining wound retractor was then placed within the incision and the lower uterine segment exposed. The bladder flap was developed with Metzenbaum scissors and pushed away from the lower uterine segment. The lower uterine segment was then incised in a transverse fashion and the cavity itself entered bluntly. The incision was extended bluntly. The infant's head was then lifted and delivered from the incision  without difficulty. The remainder of the infant delivered and the nose and mouth bulb suctioned with the cord clamped and cut as well. The infant was handed off to the waiting pediatricians. The placenta was then spontaneously expressed from the uterus and the uterus cleared of all clots and debris with moist lap sponge. The uterine incision was then repaired in 2 layers the first layer was a running locked layer of 1-0 chromic and the second an imbricating layer of the same suture. The tubes and ovaries were inspected and the gutters cleared of all clots and debris. The uterine incision was inspected and found to be hemostatic. All instruments and sponges as well as the Alexis retractor were then removed from the abdomen. The rectus muscles and peritoneum were then reapproximated with a running suture of 2-0 Vicryl. The fascia was then closed with 0 Vicryl in a running fashion. Subcutaneous tissue was reapproximated with 3-0 plain in a running fashion. The skin was closed with a subcuticular stitch of 4-0 Vicryl on a Keith needle and then reinforced with benzoin and Steri-Strips. At the conclusion of the procedure all instruments and sponge counts were correct. Patient was taken to the recovery room in good condition with her baby accompanying her skin to skin.

## 2019-08-27 NOTE — Transfer of Care (Signed)
Immediate Anesthesia Transfer of Care Note  Patient: Alexa Erickson  Procedure(s) Performed: CESAREAN SECTION (N/A Abdomen)  Patient Location: PACU  Anesthesia Type:Spinal  Level of Consciousness: awake, alert  and oriented  Airway & Oxygen Therapy: Patient Spontanous Breathing  Post-op Assessment: Report given to RN and Post -op Vital signs reviewed and stable  Post vital signs: Reviewed and stable  Last Vitals:  Vitals Value Taken Time  BP 129/82 08/27/19 1100  Temp 36.6 C 08/27/19 1100  Pulse 66 08/27/19 1100  Resp 16 08/27/19 1100  SpO2 100 % 08/27/19 1100    Last Pain:  Vitals:   08/27/19 1220  TempSrc:   PainSc: Asleep         Complications: No apparent anesthesia complications

## 2019-08-27 NOTE — Progress Notes (Signed)
Patient ID: Alexa Erickson, female   DOB: April 03, 1981, 39 y.o.   MRN: [id number] Per pt no changes in dictated H&P.  Brief exam WNL.  Ready to proceed.

## 2019-08-28 ENCOUNTER — Encounter: Payer: Self-pay | Admitting: *Deleted

## 2019-08-28 LAB — CBC
HCT: 28.1 % — ABNORMAL LOW (ref 36.0–46.0)
Hemoglobin: 8.7 g/dL — ABNORMAL LOW (ref 12.0–15.0)
MCH: 26.9 pg (ref 26.0–34.0)
MCHC: 31 g/dL (ref 30.0–36.0)
MCV: 86.7 fL (ref 80.0–100.0)
Platelets: 144 10*3/uL — ABNORMAL LOW (ref 150–400)
RBC: 3.24 MIL/uL — ABNORMAL LOW (ref 3.87–5.11)
RDW: 15 % (ref 11.5–15.5)
WBC: 7.1 10*3/uL (ref 4.0–10.5)
nRBC: 0 % (ref 0.0–0.2)

## 2019-08-28 LAB — BIRTH TISSUE RECOVERY COLLECTION (PLACENTA DONATION)

## 2019-08-28 NOTE — Lactation Note (Signed)
This note was copied from a baby's chart. Lactation Consultation Note  Patient Name: Alexa Erickson Today's Date: 08/28/2019 Reason for consult: Initial assessment;Follow-up assessment;Infant weight loss P4, 37 hour female infant with 7% weight loss. Parents feeding choice is breast and formula feeding. Per dad what is the best formula to use Similac or Enfamil LC ask their religous beliefs they said Muslim LC discussed to continue with Similac because it doesn't have pork bi-products dad was apperceive and showed dad Haal icon on bottle.  Mom had infant latched on right breast using the cross cradle hold, infant was active suckling swallows observed, no assistance needed with latch mom is experienced at breastfeeding. Mom plans to supplement infant with Similac Advance with iron after breastfeeding infant, breastfeeding supplemental sheet given based on infant's age/ hours of life Day 2 24-48 hours ( 7 -12 mls  Per feeding). Mom knows to call RN or LC if she has any questions, concerns or needs any assistance with latching infant at breast. Mom will continue with cue base feeding and not exceed 3 hours without breastfeeding infant.    Maternal Data    Feeding Feeding Type: Breast Fed  LATCH Score Latch: Grasps breast easily, tongue down, lips flanged, rhythmical sucking.  Audible Swallowing: A few with stimulation  Type of Nipple: Everted at rest and after stimulation  Comfort (Breast/Nipple): Soft / non-tender  Hold (Positioning): No assistance needed to correctly position infant at breast.  LATCH Score: 9  Interventions    Lactation Tools Discussed/Used     Consult Status Consult Status: Follow-up Date: 08/29/19 Follow-up type: In-patient    Danelle Earthly 08/28/2019, 8:58 PM

## 2019-08-28 NOTE — Progress Notes (Signed)
Post Partum Day 1 Subjective: no complaints and tolerating PO  Ambulated some to restroom only  Objective: Blood pressure 120/65, pulse 80, temperature 98.2 F (36.8 C), temperature source Oral, resp. rate 16, height 5\' 3"  (1.6 m), weight 83 kg, last menstrual period 11/20/2018, SpO2 95 %, unknown if currently breastfeeding.  Physical Exam:  General: alert and cooperative Lochia: appropriate Uterine Fundus: firm Incision: C/D/I  Recent Labs    08/28/19 0438  HGB 8.7*  HCT 28.1*    Assessment/Plan: Plan for discharge tomorrow  Encouraged ambulation today Relative anemia but not a significant drop from preop   LOS: 1 day   10/26/19 08/28/2019, 9:50 AM

## 2019-08-28 NOTE — Anesthesia Postprocedure Evaluation (Signed)
Anesthesia Post Note  Patient: Alexa Erickson  Procedure(s) Performed: CESAREAN SECTION (N/A Abdomen)     Patient location during evaluation: PACU Anesthesia Type: Spinal Level of consciousness: oriented and awake and alert Pain management: pain level controlled Vital Signs Assessment: post-procedure vital signs reviewed and stable Respiratory status: spontaneous breathing, respiratory function stable and patient connected to nasal cannula oxygen Cardiovascular status: blood pressure returned to baseline and stable Postop Assessment: no headache, no backache and no apparent nausea or vomiting Anesthetic complications: no    Last Vitals:  Vitals:   08/27/19 2340 08/28/19 0348  BP: 117/79 120/65  Pulse: 80 80  Resp: 16   Temp: 36.8 C 36.8 C  SpO2: 98% 95%    Last Pain:  Vitals:   08/28/19 0540  TempSrc:   PainSc: 1                  Mariesha Venturella

## 2019-08-29 MED ORDER — IBUPROFEN 800 MG PO TABS: 800.0000 mg | ORAL_TABLET | Freq: Three times a day (TID) | ORAL | 0 refills | Status: AC

## 2019-08-29 MED ORDER — ACETAMINOPHEN 325 MG PO TABS: 650.0000 mg | ORAL_TABLET | ORAL | 0 refills | Status: AC | PRN

## 2019-08-29 NOTE — Progress Notes (Signed)
Subjective: Postpartum Day 2 Cesarean Delivery Patient reports tolerating PO, + BM and no problems voiding.    Objective: Vital signs in last 24 hours: Temp:  [97.7 F (36.5 C)-99.2 F (37.3 C)] 98.6 F (37 C) (02/07 0523) Pulse Rate:  [61-71] 61 (02/07 0523) Resp:  [17-18] 18 (02/07 0523) BP: (124-140)/(77-85) 140/83 (02/07 0523) SpO2:  [100 %] 100 % (02/07 0523)  Physical Exam:  General: alert and cooperative Lochia: appropriate Uterine Fundus: firm Incision: dehiscence present C/D/I   Recent Labs    08/28/19 0438  HGB 8.7*  HCT 28.1*    Assessment/Plan: Status post Cesarean section. Doing well postoperatively.  Discharge home with standard precautions and return to clinic in 2 weeks.  Oliver Pila 08/29/2019, 11:12 AM

## 2019-08-29 NOTE — Lactation Note (Signed)
This note was copied from a baby's chart. Lactation Consultation Note  Patient Name: Alexa Erickson [id number] Date: 08/29/2019 Reason for consult: Follow-up assessment  P4 mother whose infant is now 27 hours old.  Mother breast fed her other three children for one year each.  The youngest child is now 38 years old.  Mother's feeding preference is breast/bottle.  Baby was asleep in the bassinet when I arrived.  Mother had no questions/concerns at this time.  She is supplementing with formula.  Encouraged mother to continue to feed at the breast prior to giving any formula supplementation.  Mother plans to begin pumping when she arrives home to have EBM to provide to baby as needed.    Engorgement prevention/treatment reviewed.  Manual pump provided with instructions for use.  Mother remembered using this pump years ago.  She also has a DEBP for home use.  Mother has our OP phone number for questions after discharge.  Father present.   Maternal Data    Feeding    LATCH Score                   Interventions    Lactation Tools Discussed/Used     Consult Status Consult Status: Complete Date: 08/29/19 Follow-up type: Call as needed    Lenorris Karger R Johnnay Pleitez 08/29/2019, 10:26 AM

## 2019-09-13 DIAGNOSIS — O139 Gestational [pregnancy-induced] hypertension without significant proteinuria, unspecified trimester: Secondary | ICD-10-CM | POA: Diagnosis not present

## 2019-10-13 DIAGNOSIS — Z1389 Encounter for screening for other disorder: Secondary | ICD-10-CM | POA: Diagnosis not present

## 2019-10-13 DIAGNOSIS — Z3009 Encounter for other general counseling and advice on contraception: Secondary | ICD-10-CM | POA: Diagnosis not present

## 2019-12-11 IMAGING — DX DG CHEST 2V
2 series · 2 of 2 positions shown · non-contrast
Comparison: None.

CLINICAL DATA: Cough, chest pain

EXAM:
CHEST  2 VIEW

[chest pa]
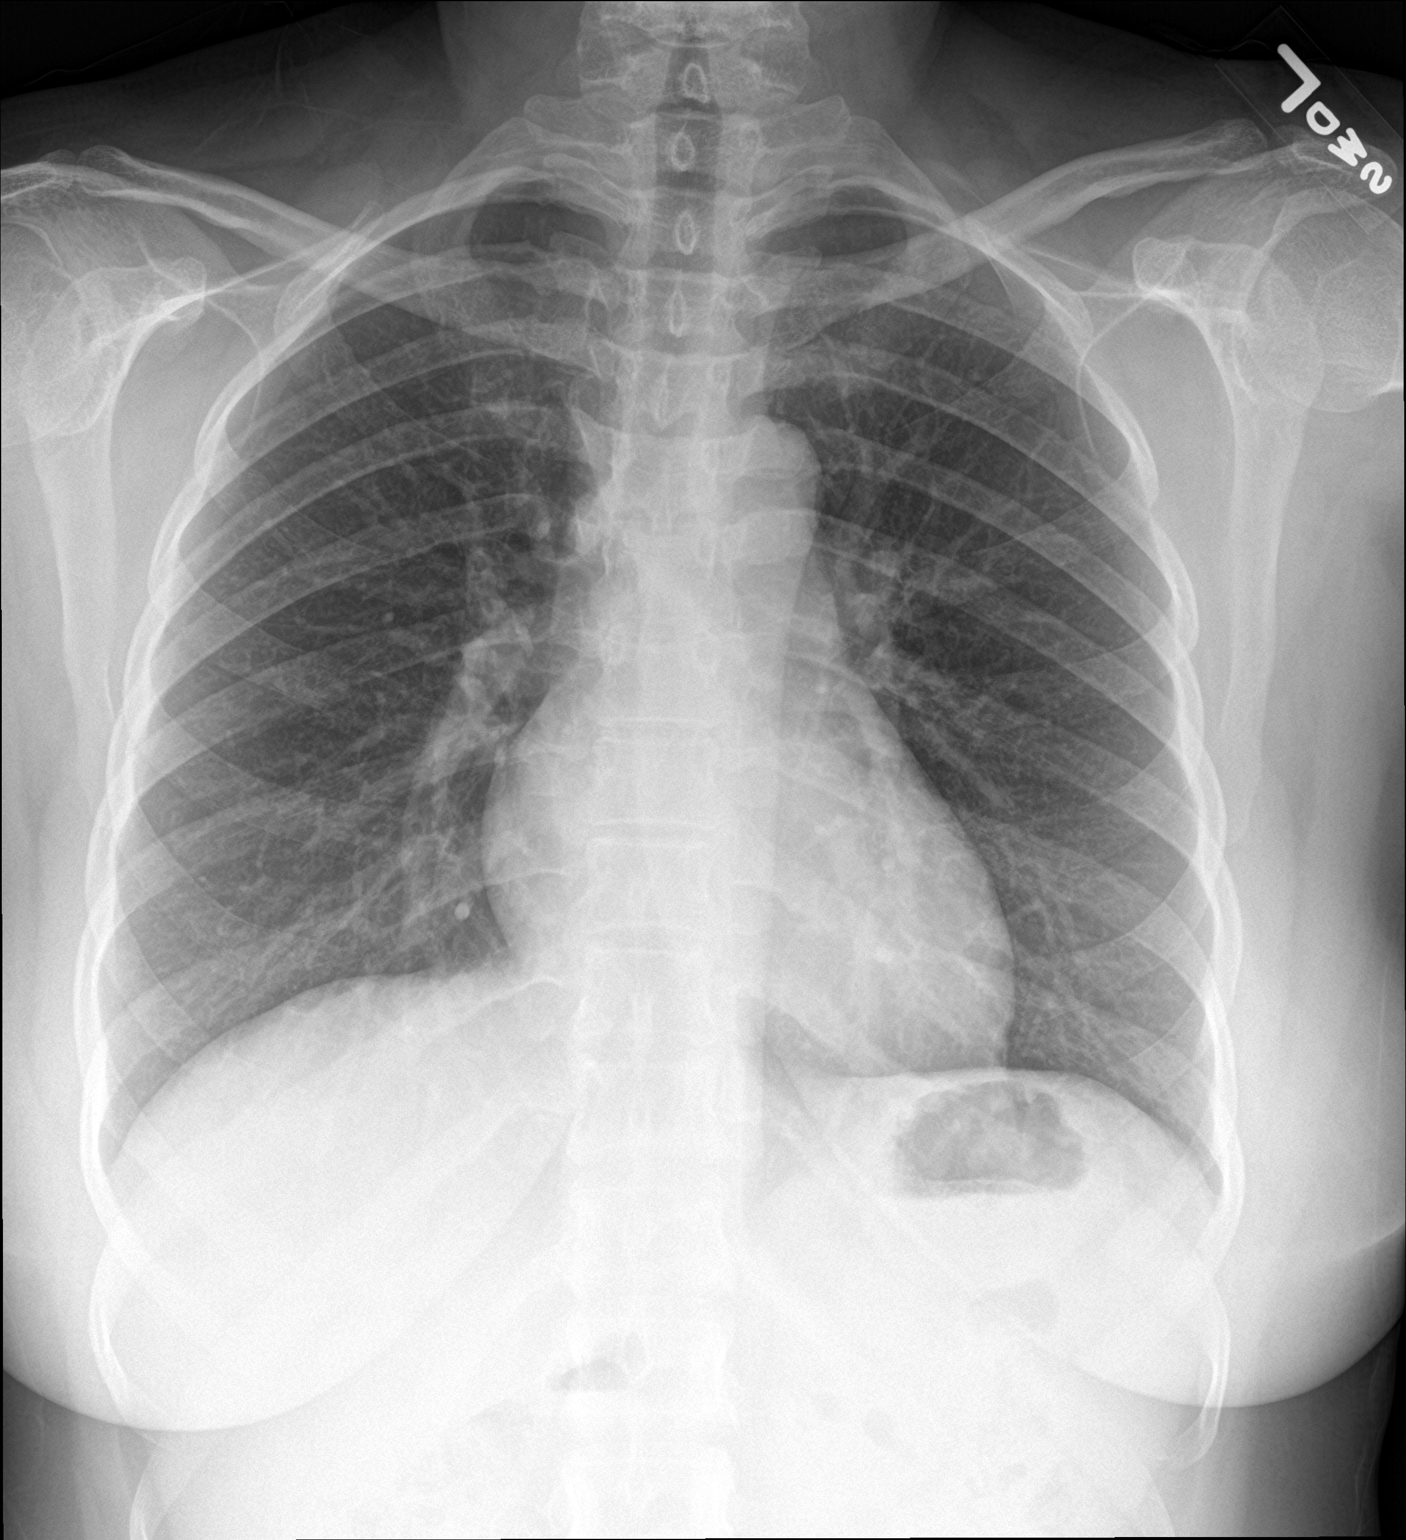

[chest lat]
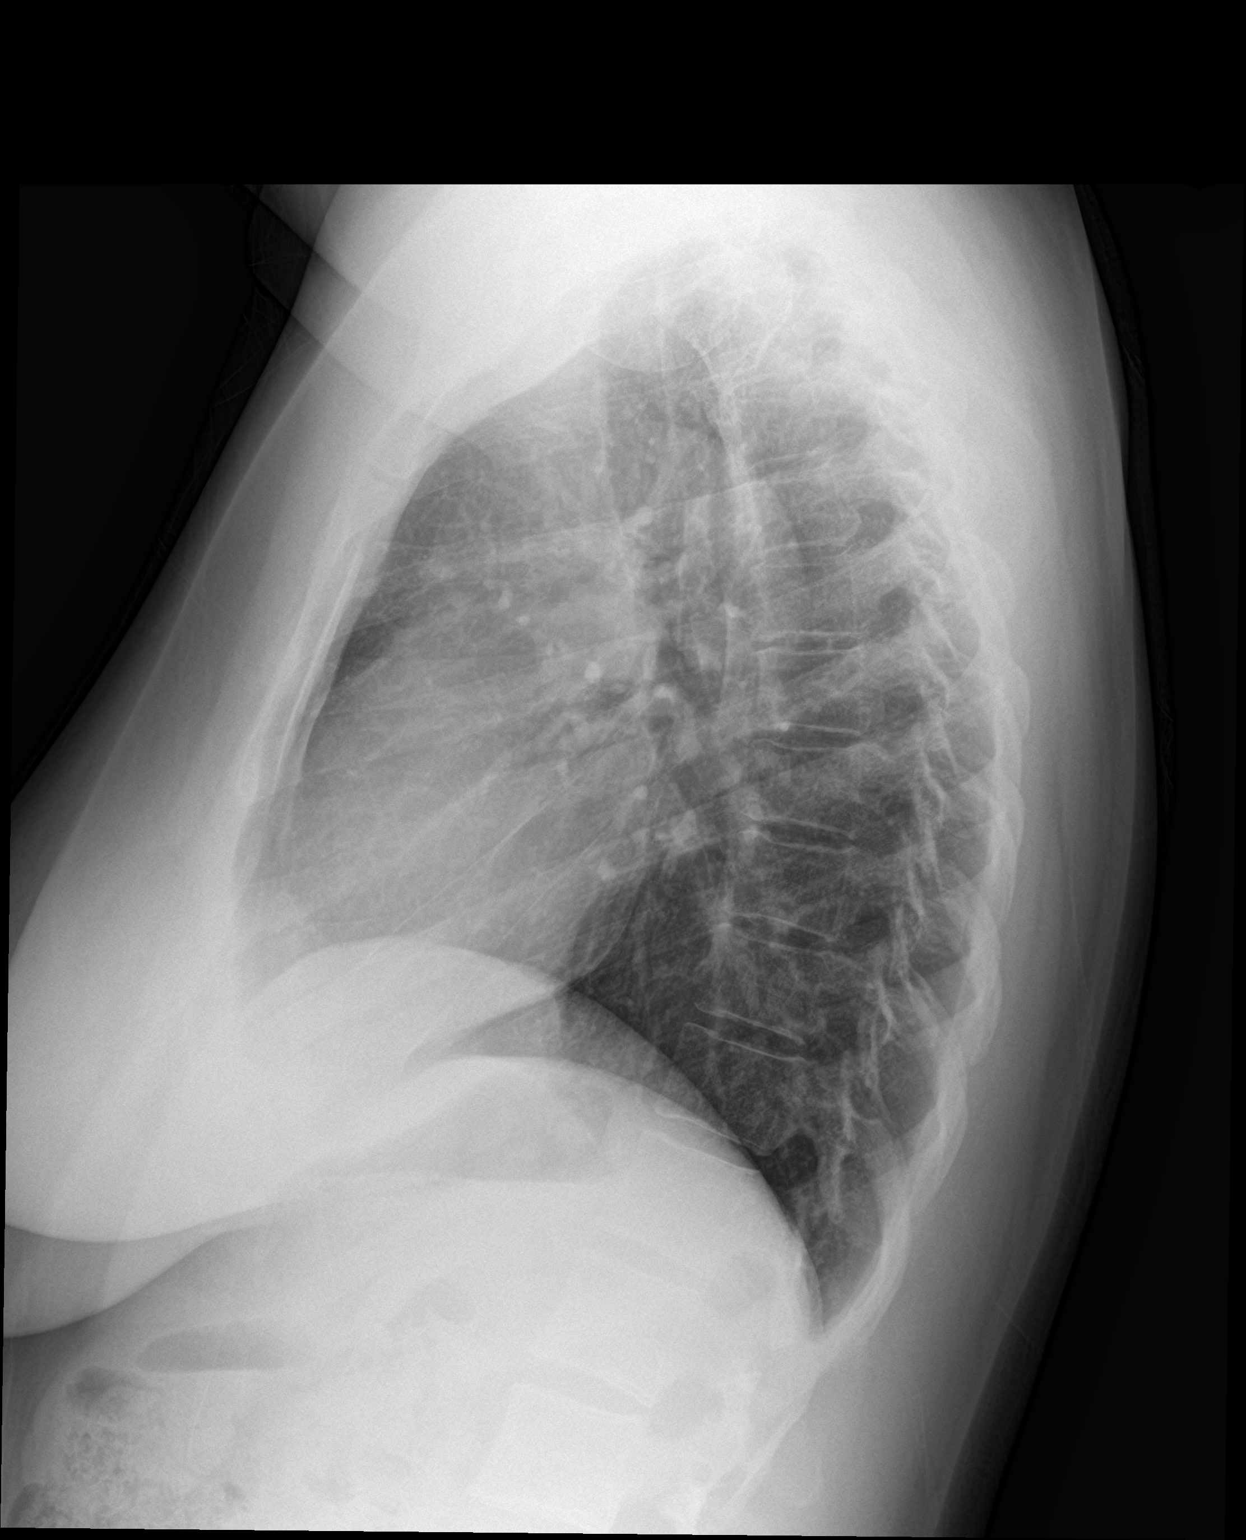

[2 of 2 positions shown; findings below may reference images not displayed]

FINDINGS: Heart and mediastinal contours are within normal limits. No focal
opacities or effusions. No acute bony abnormality.
IMPRESSION: No active cardiopulmonary disease.

## 2020-03-02 DIAGNOSIS — Z03818 Encounter for observation for suspected exposure to other biological agents ruled out: Secondary | ICD-10-CM | POA: Diagnosis not present

## 2020-03-02 DIAGNOSIS — J029 Acute pharyngitis, unspecified: Secondary | ICD-10-CM | POA: Diagnosis not present

## 2020-03-02 DIAGNOSIS — R05 Cough: Secondary | ICD-10-CM | POA: Diagnosis not present

## 2020-03-02 DIAGNOSIS — J22 Unspecified acute lower respiratory infection: Secondary | ICD-10-CM | POA: Diagnosis not present

## 2020-05-04 DIAGNOSIS — E559 Vitamin D deficiency, unspecified: Secondary | ICD-10-CM | POA: Diagnosis not present

## 2020-05-04 DIAGNOSIS — D649 Anemia, unspecified: Secondary | ICD-10-CM | POA: Diagnosis not present

## 2020-05-04 DIAGNOSIS — E785 Hyperlipidemia, unspecified: Secondary | ICD-10-CM | POA: Diagnosis not present

## 2020-05-04 DIAGNOSIS — Z0000 Encounter for general adult medical examination without abnormal findings: Secondary | ICD-10-CM | POA: Diagnosis not present

## 2020-05-04 DIAGNOSIS — Z131 Encounter for screening for diabetes mellitus: Secondary | ICD-10-CM | POA: Diagnosis not present

## 2020-05-04 DIAGNOSIS — D509 Iron deficiency anemia, unspecified: Secondary | ICD-10-CM | POA: Diagnosis not present

## 2020-08-09 DIAGNOSIS — D649 Anemia, unspecified: Secondary | ICD-10-CM | POA: Diagnosis not present

## 2020-08-09 DIAGNOSIS — E785 Hyperlipidemia, unspecified: Secondary | ICD-10-CM | POA: Diagnosis not present

## 2020-12-28 DIAGNOSIS — Z20822 Contact with and (suspected) exposure to covid-19: Secondary | ICD-10-CM | POA: Diagnosis not present

## 2021-05-08 DIAGNOSIS — D509 Iron deficiency anemia, unspecified: Secondary | ICD-10-CM | POA: Diagnosis not present

## 2021-05-08 DIAGNOSIS — L659 Nonscarring hair loss, unspecified: Secondary | ICD-10-CM | POA: Diagnosis not present

## 2021-05-08 DIAGNOSIS — Z0000 Encounter for general adult medical examination without abnormal findings: Secondary | ICD-10-CM | POA: Diagnosis not present

## 2021-05-08 DIAGNOSIS — E785 Hyperlipidemia, unspecified: Secondary | ICD-10-CM | POA: Diagnosis not present

## 2021-05-08 DIAGNOSIS — E559 Vitamin D deficiency, unspecified: Secondary | ICD-10-CM | POA: Diagnosis not present

## 2021-08-22 DIAGNOSIS — E785 Hyperlipidemia, unspecified: Secondary | ICD-10-CM | POA: Diagnosis not present

## 2021-08-27 DIAGNOSIS — Z1389 Encounter for screening for other disorder: Secondary | ICD-10-CM | POA: Diagnosis not present

## 2021-08-27 DIAGNOSIS — Z130 Encounter for screening for diseases of the blood and blood-forming organs and certain disorders involving the immune mechanism: Secondary | ICD-10-CM | POA: Diagnosis not present

## 2021-08-27 DIAGNOSIS — Z124 Encounter for screening for malignant neoplasm of cervix: Secondary | ICD-10-CM | POA: Diagnosis not present

## 2021-08-27 DIAGNOSIS — Z01411 Encounter for gynecological examination (general) (routine) with abnormal findings: Secondary | ICD-10-CM | POA: Diagnosis not present

## 2021-09-26 DIAGNOSIS — N644 Mastodynia: Secondary | ICD-10-CM | POA: Diagnosis not present

## 2021-09-26 DIAGNOSIS — R922 Inconclusive mammogram: Secondary | ICD-10-CM | POA: Diagnosis not present

## 2022-03-01 ENCOUNTER — Ambulatory Visit (HOSPITAL_BASED_OUTPATIENT_CLINIC_OR_DEPARTMENT_OTHER): Payer: Federal, State, Local not specified - PPO | Admitting: Internal Medicine

## 2022-05-21 DIAGNOSIS — Z0000 Encounter for general adult medical examination without abnormal findings: Secondary | ICD-10-CM | POA: Diagnosis not present

## 2022-05-21 DIAGNOSIS — E559 Vitamin D deficiency, unspecified: Secondary | ICD-10-CM | POA: Diagnosis not present

## 2022-05-21 DIAGNOSIS — E785 Hyperlipidemia, unspecified: Secondary | ICD-10-CM | POA: Diagnosis not present

## 2022-05-21 DIAGNOSIS — E7801 Familial hypercholesterolemia: Secondary | ICD-10-CM | POA: Diagnosis not present

## 2022-05-28 ENCOUNTER — Ambulatory Visit (HOSPITAL_BASED_OUTPATIENT_CLINIC_OR_DEPARTMENT_OTHER): Payer: Federal, State, Local not specified - PPO | Admitting: Internal Medicine

## 2022-06-04 DIAGNOSIS — D509 Iron deficiency anemia, unspecified: Secondary | ICD-10-CM | POA: Diagnosis not present

## 2022-06-04 DIAGNOSIS — E559 Vitamin D deficiency, unspecified: Secondary | ICD-10-CM | POA: Diagnosis not present

## 2022-06-04 DIAGNOSIS — E785 Hyperlipidemia, unspecified: Secondary | ICD-10-CM | POA: Diagnosis not present

## 2022-11-13 ENCOUNTER — Ambulatory Visit (HOSPITAL_BASED_OUTPATIENT_CLINIC_OR_DEPARTMENT_OTHER): Payer: Federal, State, Local not specified - PPO | Admitting: Internal Medicine

## 2022-11-13 ENCOUNTER — Encounter (HOSPITAL_BASED_OUTPATIENT_CLINIC_OR_DEPARTMENT_OTHER): Payer: Self-pay | Admitting: Internal Medicine

## 2022-11-13 VITALS — BP 130/81 | HR 85 | Ht 63.0 in | Wt 163.7 lb

## 2022-11-13 DIAGNOSIS — E7801 Familial hypercholesterolemia: Secondary | ICD-10-CM | POA: Diagnosis not present

## 2022-11-13 NOTE — Progress Notes (Signed)
LIPID CLINIC CONSULT NOTE  Chief Complaint:  Familial hyperlipidemia  Primary Care Physician: Maurice Small, MD  Primary Cardiologist:  None  HPI:  Alexa Erickson is a 42 y.o. female who is being seen today for the evaluation of familial hyperlipidemia at the request of Farris Has, MD. this is a pleasant 42 year old female who is originally from [place] however her family is living in [place].  She reports a longstanding history of high cholesterol and high cholesterol in her father and both sisters.  Her father has what sounds like heart failure and hypertension.  No report of early onset heart disease.  She has had longstanding high cholesterol.  Most recently her total cholesterol was 318 with triglycerides 183, HDL 47 and LDL 235.  She was started on atorvastatin 40 mg daily and recently the dose was increased to 80 mg daily.  Despite this, her cholesterol remains quite elevated.  This is highly suggestive of familial hyperlipidemia.  She may also have a high LP(a).  She has no known cardiovascular disease at this point.  She orts having several children as well and a number of them have elevated cholesterol.  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 She has never used smokeless tobacco. She reports that she does not drink alcohol and does not use drugs.  ALLERGIES:  No Known Allergies  ROS: Pertinent items noted in HPI and  remainder of comprehensive ROS otherwise negative.  HOME MEDS: Current Outpatient Medications on File Prior to Visit  Medication Sig Dispense Refill   acetaminophen (TYLENOL) 325 MG tablet Take 2 tablets (650 mg total) by mouth every 4 (four) hours as needed for mild pain (temperature > 101.5.). 30 tablet 0   atorvastatin (LIPITOR) 80 MG tablet Take 80 mg by mouth daily.     cholecalciferol (VITAMIN D3) 25 MCG (1000 UNIT) tablet Take 1,000 Units by mouth daily.     ferrous sulfate 325 (65 FE) MG tablet Take 325 mg by mouth daily with breakfast.     ibuprofen (ADVIL) 800 MG tablet Take 1 tablet (800 mg total) by mouth every 8 (eight) hours. 30 tablet 0   JUNEL FE 1/20 1-20 MG-MCG tablet Take 1 tablet by mouth daily.     No current facility-administered medications on file prior to visit.    LABS/IMAGING: No results found for this or any previous visit (from the past 48 hour(s)). No results found.  LIPID PANEL: No results found for: "CHOL", "TRIG", "HDL", "CHOLHDL", "VLDL", "LDLCALC", "LDLDIRECT"  WEIGHTS: Wt Readings from Last 3 Encounters:  11/13/22 163 lb 11.2 oz (74.3 kg)  08/27/19 183 lb (83 kg)  08/19/19 183 lb (83 kg)    VITALS: BP 130/81 (BP Location: Left Arm, Patient Position: Sitting, Cuff Size: Normal)   Pulse 85   Ht  (1.6 m)  Wt 163 lb 11.2 oz (74.3 kg)   SpO2 100%   BMI 29.00 kg/m   EXAM: Deferred  EKG: Deferred  ASSESSMENT: Familial hyperlipidemia, LDL greater than 190 History of high cholesterol in her father and 2 sisters and elevated cholesterol and her children  PLAN: 1.   Mrs. Lohn has very high cholesterol and likely a familial hyperlipidemia.  She is already on high-dose atorvastatin 80 mg daily with an LDL of 235.  I would like to repeat her lipids since these were last done in November 2023 and including NMR and LP(a).  She is a good candidate to consider adding PCSK9 inhibitor therapy to with the goal to try to drive her LDL down to  around 70.  If her LP(a) is elevated she should consider having her children screened for an elevated LP(a) as well.  We also talked about genetic testing however it is very likely this is genetic even without the testing.  She still may want to consider that as it sometimes affects management.  Plan then ultimately to follow-up with me in about 3 to 4 months.  Thanks again for the kind referral.  Chrystie Nose, MD, [facility]  [place]  [facility]  Medical Director of the Advanced Lipid Disorders &  Cardiovascular Risk Reduction Clinic Diplomate of the American Board of Clinical Lipidology Attending Cardiologist  Direct Dial: [phone]  Fax: [phone]  Website:  [email or web address] Nicely Buffie Herne 11/13/2022, 4:16 PM

## 2022-11-13 NOTE — Patient Instructions (Addendum)
Medication Instructions:  Your physician recommends that you continue on your current medications as directed. Please refer to the Current Medication list given to you today.  *If you need a refill on your cardiac medications before your next appointment, please call your pharmacy*   Lab Work: FASTING NMR lipoprofile and LPa as soon as able   FASTING lab work before next appointment  If you have labs (blood work) drawn today and your tests are completely normal, you will receive your results only by: MyChart Message (if you have MyChart) OR A paper copy in the mail If you have any lab test that is abnormal or we need to change your treatment, we will call you to review the results.    Follow-Up: At [facility], you and your health needs are our priority.  As part of our continuing mission to provide you with exceptional heart care, we have created designated Provider Care Teams.  These Care Teams include your primary Cardiologist (physician) and Advanced Practice Providers (APPs -  Physician Assistants and Nurse Practitioners) who all work together to provide you with the care you need, when you need it.  We recommend signing up for the patient portal called "MyChart".  Sign up information is provided on this After Visit Summary.  MyChart is used to connect with patients for Virtual Visits (Telemedicine).  Patients are able to view lab/test results, encounter notes, upcoming appointments, etc.  Non-urgent messages can be sent to your provider as well.   To learn more about what you can do with MyChart, go to [email or web address].    Your next appointment:    3-4 months with Dr. Rennis Golden

## 2022-11-14 DIAGNOSIS — Z3041 Encounter for surveillance of contraceptive pills: Secondary | ICD-10-CM | POA: Diagnosis not present

## 2022-11-14 DIAGNOSIS — Z1389 Encounter for screening for other disorder: Secondary | ICD-10-CM | POA: Diagnosis not present

## 2022-11-14 DIAGNOSIS — Z130 Encounter for screening for diseases of the blood and blood-forming organs and certain disorders involving the immune mechanism: Secondary | ICD-10-CM | POA: Diagnosis not present

## 2022-11-14 DIAGNOSIS — Z1231 Encounter for screening mammogram for malignant neoplasm of breast: Secondary | ICD-10-CM | POA: Diagnosis not present

## 2022-11-14 DIAGNOSIS — Z01419 Encounter for gynecological examination (general) (routine) without abnormal findings: Secondary | ICD-10-CM | POA: Diagnosis not present

## 2022-12-12 DIAGNOSIS — R0789 Other chest pain: Secondary | ICD-10-CM | POA: Diagnosis not present

## 2022-12-12 DIAGNOSIS — M546 Pain in thoracic spine: Secondary | ICD-10-CM | POA: Diagnosis not present

## 2023-04-01 ENCOUNTER — Encounter (HOSPITAL_BASED_OUTPATIENT_CLINIC_OR_DEPARTMENT_OTHER): Payer: Federal, State, Local not specified - PPO | Admitting: Internal Medicine

## 2023-05-26 DIAGNOSIS — M2550 Pain in unspecified joint: Secondary | ICD-10-CM | POA: Diagnosis not present

## 2023-05-26 DIAGNOSIS — Z0000 Encounter for general adult medical examination without abnormal findings: Secondary | ICD-10-CM | POA: Diagnosis not present

## 2023-05-26 DIAGNOSIS — E559 Vitamin D deficiency, unspecified: Secondary | ICD-10-CM | POA: Diagnosis not present

## 2023-05-26 DIAGNOSIS — E785 Hyperlipidemia, unspecified: Secondary | ICD-10-CM | POA: Diagnosis not present

## 2023-05-26 DIAGNOSIS — D509 Iron deficiency anemia, unspecified: Secondary | ICD-10-CM | POA: Diagnosis not present

## 2023-07-28 DIAGNOSIS — E7800 Pure hypercholesterolemia, unspecified: Secondary | ICD-10-CM | POA: Diagnosis not present

## 2023-07-29 LAB — NMR, LIPOPROFILE
Cholesterol, Total: 408 mg/dL — ABNORMAL HIGH (ref 100–199)
HDL Particle Number: 28.2 umol/L — ABNORMAL LOW (ref >=30.5)
HDL-C: 50 mg/dL (ref >39)
LDL Particle Number: >3500 nmol/L — ABNORMAL HIGH (ref <1000)
LDL Size: 21.7 nmol (ref >20.5)
LDL-C (NIH Calc): 328 mg/dL — ABNORMAL HIGH (ref 0–99)
LP-IR Score: 34 (ref <=45)
Small LDL Particle Number: 1280 nmol/L — ABNORMAL HIGH (ref <=527)
Triglycerides: 149 mg/dL (ref 0–149)

## 2023-07-29 LAB — LIPOPROTEIN A (LPA): Lipoprotein (a): 103.3 nmol/L — ABNORMAL HIGH (ref <75.0)

## 2023-07-30 ENCOUNTER — Ambulatory Visit (HOSPITAL_BASED_OUTPATIENT_CLINIC_OR_DEPARTMENT_OTHER): Payer: Federal, State, Local not specified - PPO | Admitting: Internal Medicine

## 2023-07-30 ENCOUNTER — Encounter (HOSPITAL_BASED_OUTPATIENT_CLINIC_OR_DEPARTMENT_OTHER): Payer: Self-pay | Admitting: Internal Medicine

## 2023-07-30 VITALS — BP 115/72 | HR 77 | Resp 16 | Ht 63.0 in | Wt 172.0 lb

## 2023-07-30 DIAGNOSIS — E7841 Elevated Lipoprotein(a): Secondary | ICD-10-CM

## 2023-07-30 DIAGNOSIS — E7801 Familial hypercholesterolemia: Secondary | ICD-10-CM | POA: Diagnosis not present

## 2023-07-30 MED ORDER — ROSUVASTATIN CALCIUM 20 MG PO TABS: 20.0000 mg | ORAL_TABLET | Freq: Every day | ORAL | 3 refills | Status: AC

## 2023-07-30 NOTE — Patient Instructions (Signed)
 Medication Instructions:  STOP atorvastatin  START rosuvastatin  20mg  once daily  *If you need a refill on your cardiac medications before your next appointment, please call your pharmacy*   Lab Work: FASTING NMR lipoprofile in 3-4 months  If you have labs (blood work) drawn today and your tests are completely normal, you will receive your results only by: MyChart Message (if you have MyChart) OR A paper copy in the mail If you have any lab test that is abnormal or we need to change your treatment, we will call you to review the results.    Follow-Up: At [facility], you and your health needs are our priority.  As part of our continuing mission to provide you with exceptional heart care, we have created designated Provider Care Teams.  These Care Teams include your primary Cardiologist (physician) and Advanced Practice Providers (APPs -  Physician Assistants and Nurse Practitioners) who all work together to provide you with the care you need, when you need it.  We recommend signing up for the patient portal called MyChart.  Sign up information is provided on this After Visit Summary.  MyChart is used to connect with patients for Virtual Visits (Telemedicine).  Patients are able to view lab/test results, encounter notes, upcoming appointments, etc.  Non-urgent messages can be sent to your provider as well.   To learn more about what you can do with MyChart, go to [email or web address].    Your next appointment:    3-4 months with Dr. Mona

## 2023-07-30 NOTE — Progress Notes (Signed)
 LIPID CLINIC CONSULT NOTE  Chief Complaint:  Familial hyperlipidemia  Primary Care Physician: Kip Righter, MD  Primary Cardiologist:  None  HPI:  Alexa Erickson is a 43 y.o. female who is being seen today for the evaluation of familial hyperlipidemia at the request of Signa Dire, MD. this is a pleasant 43 year old female who is originally from [place] however her family is living in [place].  She reports a longstanding history of high cholesterol and high cholesterol in her father and both sisters.  Her father has what sounds like heart failure and hypertension.  No report of early onset heart disease.  She has had longstanding high cholesterol.  Most recently her total cholesterol was 318 with triglycerides 183, HDL 47 and LDL 235.  She was started on atorvastatin 40 mg daily and recently the dose was increased to 80 mg daily.  Despite this, her cholesterol remains quite elevated.  This is highly suggestive of familial hyperlipidemia.  She may also have a high LP(a).  She has no known cardiovascular disease at this point.  She orts having several children as well and a number of them have elevated cholesterol.  07/30/2023  Alexa Erickson is seen today in follow-up.  She had repeat cholesterol testing which showed significant increases in her cholesterol.  Her LDL is now very high at 328 with an LDL particle number greater than 3500.  Previously her LDL was 237.  This was on atorvastatin 80 mg daily.  She said she had stopped taking that medication for a month or 2 because of side effects.  She notes that she gets pain in her legs when taking it.  She had not notified us  of that.  She did have a minimally elevated LP(a) at 103 nmol/L.    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 She has never used smokeless tobacco. She reports that she does not drink alcohol and does not use drugs.  ALLERGIES:  No Known Allergies  ROS: Pertinent items noted in HPI and remainder of comprehensive ROS otherwise negative.  HOME MEDS: Current Outpatient Medications on File Prior to Visit  Medication Sig Dispense Refill   acetaminophen  (TYLENOL ) 325 MG tablet Take 2 tablets (650 mg total) by mouth every 4 (four) hours as needed for mild pain (temperature > 101.5.). 30 tablet 0   atorvastatin (LIPITOR) 80 MG tablet Take 80 mg by mouth daily.     cholecalciferol (VITAMIN D3) 25 MCG (1000 UNIT) tablet Take 1,000 Units by mouth daily.     ferrous sulfate 325 (65 FE) MG tablet Take 325 mg by mouth daily with breakfast.     ibuprofen  (ADVIL ) 800 MG tablet Take 1 tablet (800 mg total) by mouth every 8 (eight) hours. 30 tablet 0   JUNEL FE 1/20 1-20 MG-MCG tablet Take 1 tablet by mouth  daily.     No current facility-administered medications on file prior to visit.    LABS/IMAGING: No results found for this or any previous visit (from the past 48 hours). No results found.  LIPID PANEL: No results found for: CHOL, TRIG, HDL, CHOLHDL, VLDL, LDLCALC, LDLDIRECT  WEIGHTS: Wt Readings from Last 3 Encounters:  07/30/23 172 lb (78 kg)  11/13/22 163 lb 11.2 oz (74.3 kg)  08/27/19 183 lb (83 kg)    VITALS: BP 115/72 (BP Location: Right Arm, Patient Position: Sitting, Cuff Size: Normal)   Pulse 77   Resp 16   Ht 5' 3 (1.6 m)   Wt 172 lb (78 kg)   SpO2 99%   BMI 30.47 kg/m    EXAM: Deferred  EKG: Deferred  ASSESSMENT: Familial hyperlipidemia, LDL greater than 190 History of high cholesterol in her father and 2 sisters and elevated cholesterol and her children  PLAN: 1.   Alexa Erickson unfortunately has had side effects on atorvastatin.  Her cholesterol is gone up after stopping it for some period of time.  I advised stopping the atorvastatin we will switch her to rosuvastatin  20 mg daily.  If this is well-tolerated we will plan repeat lipids in 3 months.  At that time I expect that her cholesterol will still remain elevated and she will likely need additional therapy, probably PCSK9 inhibitor.  We discussed that at some length today.  Plan follow-up with me after repeat labs in about 3 months.  Vinie KYM Maxcy, MD, [facility], FACP  [place]  [facility]  Medical Director of the Advanced Lipid Disorders &  Cardiovascular Risk Reduction Clinic Diplomate of the American Board of Clinical Lipidology Attending Cardiologist  Direct Dial: [phone]  Fax: [phone]  Website:  [email or web address] Vinie BROCKS Alexa Erickson 07/30/2023, 4:18 PM

## 2023-11-19 DIAGNOSIS — Z01419 Encounter for gynecological examination (general) (routine) without abnormal findings: Secondary | ICD-10-CM | POA: Diagnosis not present

## 2023-11-19 DIAGNOSIS — Z130 Encounter for screening for diseases of the blood and blood-forming organs and certain disorders involving the immune mechanism: Secondary | ICD-10-CM | POA: Diagnosis not present

## 2023-11-19 DIAGNOSIS — Z1231 Encounter for screening mammogram for malignant neoplasm of breast: Secondary | ICD-10-CM | POA: Diagnosis not present

## 2023-11-24 DIAGNOSIS — E7801 Familial hypercholesterolemia: Secondary | ICD-10-CM | POA: Diagnosis not present

## 2023-11-25 ENCOUNTER — Encounter (HOSPITAL_BASED_OUTPATIENT_CLINIC_OR_DEPARTMENT_OTHER): Payer: Self-pay | Admitting: Internal Medicine

## 2023-11-25 ENCOUNTER — Ambulatory Visit (HOSPITAL_BASED_OUTPATIENT_CLINIC_OR_DEPARTMENT_OTHER): Payer: Federal, State, Local not specified - PPO | Admitting: Internal Medicine

## 2023-11-25 VITALS — BP 118/86 | HR 93 | Ht 63.0 in | Wt 167.0 lb

## 2023-11-25 DIAGNOSIS — E7801 Familial hypercholesterolemia: Secondary | ICD-10-CM

## 2023-11-25 DIAGNOSIS — E7841 Elevated Lipoprotein(a): Secondary | ICD-10-CM

## 2023-11-25 LAB — NMR, LIPOPROFILE
Cholesterol, Total: 327 mg/dL — ABNORMAL HIGH (ref 100–199)
HDL Particle Number: 32.7 umol/L (ref >=30.5)
HDL-C: 54 mg/dL (ref >39)
LDL Particle Number: 3058 nmol/L — ABNORMAL HIGH (ref <1000)
LDL Size: 21.4 nm (ref >20.5)
LDL-C (NIH Calc): 261 mg/dL — ABNORMAL HIGH (ref 0–99)
LP-IR Score: 59 — ABNORMAL HIGH (ref <=45)
Small LDL Particle Number: 651 nmol/L — ABNORMAL HIGH (ref <=527)
Triglycerides: 79 mg/dL (ref 0–149)

## 2023-11-25 NOTE — Patient Instructions (Signed)
 Medication Instructions:  NO CHANGES  *If you need a refill on your cardiac medications before your next appointment, please call your pharmacy*  Lab Work: FASTING NMR lipoprofile in 6 months  If you have labs (blood work) drawn today and your tests are completely normal, you will receive your results only by: MyChart Message (if you have MyChart) OR A paper copy in the mail If you have any lab test that is abnormal or we need to change your treatment, we will call you to review the results.  Follow-Up: At [facility], you and your health needs are our priority.  As part of our continuing mission to provide you with exceptional heart care, our providers are all part of one team.  This team includes your primary Cardiologist (physician) and Advanced Practice Providers or APPs (Physician Assistants and Nurse Practitioners) who all work together to provide you with the care you need, when you need it.  Your next appointment:    6 months with Dr. Maximo Spar or Slater Duncan NP -- lipid clinic  We recommend signing up for the patient portal called "MyChart".  Sign up information is provided on this After Visit Summary.  MyChart is used to connect with patients for Virtual Visits (Telemedicine).  Patients are able to view lab/test results, encounter notes, upcoming appointments, etc.  Non-urgent messages can be sent to your provider as well.   To learn more about what you can do with MyChart, go to [email or web address].

## 2023-11-25 NOTE — Progress Notes (Signed)
 LIPID CLINIC CONSULT NOTE  Chief Complaint:  Familial hyperlipidemia  Primary Care Physician: Ronna Coho, MD  Primary Cardiologist:  None  HPI:  Alexa Erickson is a 43 y.o. female who is being seen today for the evaluation of familial hyperlipidemia at the request of Ronna Coho, MD. this is a pleasant 43 year old female who is originally from [place] however her family is living in [place].  She reports a longstanding history of high cholesterol and high cholesterol in her father and both sisters.  Her father has what sounds like heart failure and hypertension.  No report of early onset heart disease.  She has had longstanding high cholesterol.  Most recently her total cholesterol was 318 with triglycerides 183, HDL 47 and LDL 235.  She was started on atorvastatin 40 mg daily and recently the dose was increased to 80 mg daily.  Despite this, her cholesterol remains quite elevated.  This is highly suggestive of familial hyperlipidemia.  She may also have a high LP(a).  She has no known cardiovascular disease at this point.  She orts having several children as well and a number of them have elevated cholesterol.  07/30/2023  Alexa Erickson is seen today in follow-up.  She had repeat cholesterol testing which showed significant increases in her cholesterol.  Her LDL is now very high at 328 with an LDL particle number greater than 3500.  Previously her LDL was 237.  This was on atorvastatin 80 mg daily.  She said she had stopped taking that medication for a month or 2 because of side effects.  She notes that she gets pain in her legs when taking it.  She had not notified us  of that.  She did have a minimally elevated LP(a) at 103 nmol/L.   . 11/25/2023  Alexa Erickson is seen today in follow-up.  She seems to be doing well on rosuvastatin .  She denies any side effects that she had previously on atorvastatin.  Her lipid's are much better.  LDL particle number now 3058 down from over  3500.  LDL 261 (previously 328).  She still has marked dyslipidemia.  She was also noted to have a high LP(a).  I have strongly suggested adding Repatha because it would also add further lipid lowering and improvement in her LP(a) however she is somewhat hesitant due to some concerns about side effects.  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 She has never used smokeless tobacco. She reports that she does not drink alcohol and does not use drugs.  ALLERGIES:  Allergies  Allergen Reactions   Atorvastatin     myalgias    ROS: Pertinent items noted in HPI and remainder of comprehensive ROS otherwise negative.  HOME MEDS: Current Outpatient Medications on File Prior to Visit  Medication Sig Dispense Refill   acetaminophen  (TYLENOL ) 325 MG  tablet Take 2 tablets (650 mg total) by mouth every 4 (four) hours as needed for mild pain (temperature > 101.5.). 30 tablet 0   cholecalciferol (VITAMIN D3) 25 MCG (1000 UNIT) tablet Take 1,000 Units by mouth daily.     ferrous sulfate 325 (65 FE) MG tablet Take 325 mg by mouth daily with breakfast.     ibuprofen  (ADVIL ) 800 MG tablet Take 1 tablet (800 mg total) by mouth every 8 (eight) hours. 30 tablet 0   JUNEL FE 1/20 1-20 MG-MCG tablet Take 1 tablet by mouth daily.     rosuvastatin  (CRESTOR ) 20 MG tablet Take 1 tablet (20 mg total) by mouth daily. 90 tablet 3   No current facility-administered medications on file  prior to visit.    LABS/IMAGING: Results for orders placed or performed in visit on 07/30/23 (from the past 48 hours)  NMR, lipoprofile     Status: Abnormal   Collection Time: 11/24/23 10:12 AM  Result Value Ref Range   LDL Particle Number [id number] (H) <1,000 nmol/L    Comment:                           Low                   < 1000                           Moderate         1000 - 1299                           Borderline-High  1300 - 1599                           High             1600 - 2000                           Very High             > 2000    LDL-C (NIH Calc) 261 (H) 0 - 99 mg/dL    Comment:                           Optimal               <  100                           Above optimal     100 -  129                           Borderline        130 -  159                           High              160 -  189                           Very high             >  189    HDL-C 54 >39 mg/dL   Triglycerides 79 0 - 149 mg/dL   Cholesterol, Total 295 (H) 100 - 199 mg/dL   HDL Particle Number [id number] >=30.5 umol/L   Small LDL Particle Number [id number] (H) <=527 nmol/L   LDL Size 21.4 >20.5 nm    Comment:  ----------------------------------------------------------                  ** INTERPRETATIVE INFORMATION**                  PARTICLE CONCENTRATION AND SIZE                     <--Lower CVD Risk   Higher CVD Risk-->   LDL AND HDL PARTICLES   Percentile in Reference Population   HDL-P (total)        High     75th    50th    25th   Low                        >34.9    34.9    30.5    26.7   <26.7   Small LDL-P          Low      25th    50th    75th   High                        <117     117     527     839    >839   LDL Size   <-Large (Pattern A)->    <-Small (Pattern B)->                     23.0    20.6           20.5      19.0  ---------------------------------------------------------- Small LDL-P and LDL Size are associated with CVD risk, but not after LDL-P is taken into account.     LP-IR Score 59 (H) <=45    Comment: INSULIN RESISTANCE MARKER     <--Insulin Sensitive    Insulin Resistant-->            Percentile in Reference Population Insulin Resistance Score LP-IR Score   Low   25th   50th   75th   High               <27   27     45     63     >63 LP-IR Score is inaccurate if patient is non-fasting. The LP-IR score is a laboratory developed index that has been associated with insulin resistance and diabetes risk and should be used as one component of a physician's clinical assessment.    No results found.  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   Mrs. Labo seems to be tolerating rosuvastatin  much better with good improvement in her cholesterol however her LDL still remains at 261.  She needs additional lipid-lowering and would benefit from a PCSK9 inhibitor.  I am advising her to start Repatha but she has some concerns about potential side effects.  In addition her LP(a) was elevated which likely could come down with the Repatha as well.  She wants to continue on her current therapy with repeat lipids in about 6 months and then may consider therapy at that time.  Hazle Lites, MD, [facility], FNLA, FACP  [place]  [facility]  Medical Director of the Advanced Lipid Disorders &  Cardiovascular Risk Reduction Clinic Diplomate of the American Board of Clinical Lipidology Attending Cardiologist  Direct Dial: [phone]   Fax: [phone]  Website:  [email or web address] 11/25/2023, 4:23 PM

## 2024-04-12 DIAGNOSIS — K080 Exfoliation of teeth due to systemic causes: Secondary | ICD-10-CM | POA: Diagnosis not present

## 2024-05-28 DIAGNOSIS — E559 Vitamin D deficiency, unspecified: Secondary | ICD-10-CM | POA: Diagnosis not present

## 2024-05-28 DIAGNOSIS — Z0000 Encounter for general adult medical examination without abnormal findings: Secondary | ICD-10-CM | POA: Diagnosis not present

## 2024-05-28 DIAGNOSIS — D509 Iron deficiency anemia, unspecified: Secondary | ICD-10-CM | POA: Diagnosis not present

## 2024-05-28 DIAGNOSIS — R072 Precordial pain: Secondary | ICD-10-CM | POA: Diagnosis not present

## 2024-05-28 DIAGNOSIS — E785 Hyperlipidemia, unspecified: Secondary | ICD-10-CM | POA: Diagnosis not present

## 2024-06-04 ENCOUNTER — Other Ambulatory Visit (HOSPITAL_COMMUNITY): Payer: Self-pay | Admitting: *Deleted

## 2024-06-04 DIAGNOSIS — R079 Chest pain, unspecified: Secondary | ICD-10-CM

## 2024-06-26 ENCOUNTER — Other Ambulatory Visit (HOSPITAL_COMMUNITY): Payer: Self-pay | Admitting: *Deleted

## 2024-06-26 ENCOUNTER — Encounter (HOSPITAL_COMMUNITY): Payer: Self-pay

## 2024-06-26 MED ORDER — METOPROLOL TARTRATE 100 MG PO TABS: ORAL_TABLET | ORAL | 0 refills | Status: AC

## 2024-06-28 ENCOUNTER — Telehealth (HOSPITAL_COMMUNITY): Payer: Self-pay | Admitting: *Deleted

## 2024-06-28 NOTE — Telephone Encounter (Signed)

## 2024-06-29 ENCOUNTER — Ambulatory Visit (HOSPITAL_COMMUNITY)
Admission: RE | Admit: 2024-06-29 | Discharge: 2024-06-29 | Disposition: A | Source: Ambulatory Visit | Attending: Family Medicine | Admitting: Family Medicine

## 2024-06-29 DIAGNOSIS — R079 Chest pain, unspecified: Secondary | ICD-10-CM | POA: Diagnosis not present

## 2024-06-29 MED ORDER — IOHEXOL 350 MG/ML SOLN
100.0000 mL | Freq: Once | INTRAVENOUS | Status: AC | PRN
  Administered 2024-06-29: 100 mL via INTRAVENOUS

## 2024-06-29 MED ORDER — NITROGLYCERIN 0.4 MG SL SUBL
0.8000 mg | SUBLINGUAL_TABLET | Freq: Once | SUBLINGUAL | Status: AC
  Administered 2024-06-29: 0.8 mg via SUBLINGUAL

## 2024-07-12 DIAGNOSIS — I2699 Other pulmonary embolism without acute cor pulmonale: Secondary | ICD-10-CM | POA: Diagnosis not present

## 2024-07-12 DIAGNOSIS — E785 Hyperlipidemia, unspecified: Secondary | ICD-10-CM | POA: Diagnosis not present

## 2024-07-15 ENCOUNTER — Emergency Department (HOSPITAL_BASED_OUTPATIENT_CLINIC_OR_DEPARTMENT_OTHER)
Admission: EM | Admit: 2024-07-15 | Discharge: 2024-07-16 | Disposition: A | Discharge status: Home / Self Care | Attending: Emergency Medicine | Admitting: Emergency Medicine

## 2024-07-15 ENCOUNTER — Encounter (HOSPITAL_BASED_OUTPATIENT_CLINIC_OR_DEPARTMENT_OTHER): Payer: Self-pay

## 2024-07-15 ENCOUNTER — Emergency Department (HOSPITAL_BASED_OUTPATIENT_CLINIC_OR_DEPARTMENT_OTHER): Admitting: Radiology

## 2024-07-15 ENCOUNTER — Other Ambulatory Visit: Payer: Self-pay

## 2024-07-15 ENCOUNTER — Emergency Department (HOSPITAL_BASED_OUTPATIENT_CLINIC_OR_DEPARTMENT_OTHER)

## 2024-07-15 DIAGNOSIS — R091 Pleurisy: Secondary | ICD-10-CM | POA: Insufficient documentation

## 2024-07-15 DIAGNOSIS — R0602 Shortness of breath: Secondary | ICD-10-CM | POA: Diagnosis not present

## 2024-07-15 DIAGNOSIS — M79662 Pain in left lower leg: Secondary | ICD-10-CM | POA: Insufficient documentation

## 2024-07-15 DIAGNOSIS — R079 Chest pain, unspecified: Secondary | ICD-10-CM | POA: Diagnosis not present

## 2024-07-15 DIAGNOSIS — M79661 Pain in right lower leg: Secondary | ICD-10-CM | POA: Diagnosis not present

## 2024-07-15 LAB — BASIC METABOLIC PANEL WITH GFR
Anion gap: 10 (ref 5–15)
BUN: 10 mg/dL (ref 6–20)
CO2: 25 mmol/L (ref 22–32)
Calcium: 9.9 mg/dL (ref 8.9–10.3)
Chloride: 101 mmol/L (ref 98–111)
Creatinine, Ser: 0.57 mg/dL (ref 0.44–1.00)
GFR, Estimated: >60 mL/min
Glucose, Bld: 93 mg/dL (ref 70–99)
Potassium: 4.5 mmol/L (ref 3.5–5.1)
Sodium: 136 mmol/L (ref 135–145)

## 2024-07-15 LAB — CBC
HCT: 39.4 % (ref 36.0–46.0)
Hemoglobin: 13 g/dL (ref 12.0–15.0)
MCH: 29.1 pg (ref 26.0–34.0)
MCHC: 33 g/dL (ref 30.0–36.0)
MCV: 88.3 fL (ref 80.0–100.0)
Platelets: 247 K/uL (ref 150–400)
RBC: 4.46 MIL/uL (ref 3.87–5.11)
RDW: 15.4 % (ref 11.5–15.5)
WBC: 6.6 K/uL (ref 4.0–10.5)
nRBC: 0 % (ref 0.0–0.2)

## 2024-07-15 LAB — PREGNANCY, URINE: Preg Test, Ur: NEGATIVE

## 2024-07-15 LAB — TROPONIN T, HIGH SENSITIVITY
Troponin T High Sensitivity: <15 ng/L (ref 0–19)
Troponin T High Sensitivity: <15 ng/L (ref 0–19)

## 2024-07-15 MED ORDER — IOHEXOL 350 MG/ML SOLN
75.0000 mL | Freq: Once | INTRAVENOUS | Status: AC | PRN
  Administered 2024-07-15: 75 mL via INTRAVENOUS

## 2024-07-15 NOTE — Discharge Instructions (Signed)
 1.  Continue your Eliquis as prescribed until you see your doctor and your CT scans and results are reviewed. 2.  At this time your CT scan does not show any blood clot present. 3.  People can have pain in their chest from inflammation after having a clot or they can have pleurisy, a condition for which there is not always a known cause.

## 2024-07-15 NOTE — ED Triage Notes (Signed)
 Pt c/o CP onset 12p. Dx w PE on Monday, started eliquis. Advises pain is pressure all over, some sharpness. Advises that pain is the same as when she was dx w PE, but the new thing is the sharpness.

## 2024-07-15 NOTE — ED Provider Notes (Signed)
 " [place] AT [facility] Provider Note   CSN: [id number] Arrival date & time: 07/15/24  8087     Patient presents with: Chest Pain (Dx PE on Monday)   Alexa Erickson is a 43 y.o. female.   HPI Patient reports that she got diagnosed with a pulmonary embolus based on CT coronary artery calcium  scoring study.  It was done at the beginning of the month 12\6.  At then got an over read and on 12\28 was diagnosed with PE.  Patient's provider was contacted and she was started on Eliquis yesterday.  Patient reports that she was fine yesterday but then today she got a lot more diffuse pain on the left side of her chest and then recurrent episodes of sharp pains.  No syncopal episode.  Patient reports sometimes she does feel short of breath.  IIt did not get substantially worse today.  By review of EMR plan was for the patient to see hematology for workup for clotting disorder but has not yet been done.    Prior to Admission medications  Medication Sig Start Date End Date Taking? Authorizing Provider  acetaminophen  (TYLENOL ) 325 MG tablet Take 2 tablets (650 mg total) by mouth every 4 (four) hours as needed for mild pain (temperature > 101.5.). 08/29/19   Estelle Service, MD  cholecalciferol (VITAMIN D3) 25 MCG (1000 UNIT) tablet Take 1,000 Units by mouth daily.    [provider]  ferrous sulfate 325 (65 FE) MG tablet Take 325 mg by mouth daily with breakfast.    [provider]  ibuprofen  (ADVIL ) 800 MG tablet Take 1 tablet (800 mg total) by mouth every 8 (eight) hours. 08/29/19   Estelle Service, MD  JUNEL FE 1/20 1-20 MG-MCG tablet Take 1 tablet by mouth daily. 10/04/22   [provider]  metoprolol  tartrate (LOPRESSOR ) 100 MG tablet Take tablet (100mg ) TWO hours prior to your cardiac CT scan. 06/26/24   O'NealDarryle Ned, MD  rosuvastatin  (CRESTOR ) 20 MG tablet Take 1 tablet (20 mg total) by mouth daily. 07/30/23 10/28/23  Mona Vinie BROCKS, MD    Allergies: Atorvastatin    Review of Systems  Updated Vital Signs BP (!) 167/92   Pulse 77   Temp (!) 97.5 F (36.4 C)   Resp 16   SpO2 100%   Physical Exam Constitutional:      Comments: Alert nontoxic well in appearance.  HENT:     Mouth/Throat:     Pharynx: Oropharynx is clear.  Cardiovascular:     Rate and Rhythm: Normal rate and regular rhythm.  Pulmonary:     Effort: Pulmonary effort is normal.     Breath sounds: Normal breath sounds.  Abdominal:     General: There is no distension.     Palpations: Abdomen is soft.     Tenderness: There is no abdominal tenderness.  Musculoskeletal:     Comments: No peripheral edema.  Patient does endorse some tenderness to both calves.  Soft and pliable.  Skin:    General: Skin is warm and dry.  Neurological:     General: No focal deficit present.     Mental Status: She is oriented to person, place, and time.     Motor: No weakness.     Coordination: Coordination normal.  Psychiatric:        Mood and Affect: Mood normal.     (all labs ordered are listed, but only abnormal results are displayed) Labs Reviewed  BASIC METABOLIC PANEL WITH GFR  CBC  PREGNANCY, URINE  TROPONIN T, HIGH SENSITIVITY  TROPONIN T, HIGH SENSITIVITY    EKG: EKG Interpretation Date/Time:  Thursday July 15 2024 19:23:29 EST Ventricular Rate:  77 PR Interval:  134 QRS Duration:  80 QT Interval:  384 QTC Calculation: 434 R Axis:   79  Text Interpretation: Normal sinus rhythm Normal ECG When compared with ECG of 02-Aug-2017 15:31, No significant change was found Confirmed by Armenta Canning [phone]) on 07/15/2024 9:20:26 PM  Radiology: CT Angio Chest PE W/Cm &/Or Wo Cm Result Date: 07/15/2024 EXAM: CTA CHEST 07/15/2024 10:56:31 PM TECHNIQUE: CTA of the chest was performed after the administration of intravenous contrast. Multiplanar reformatted images are provided for review. MIP images are provided for review. Automated  exposure control, iterative reconstruction, and/or weight based adjustment of the mA/kV was utilized to reduce the radiation dose to as low as reasonably achievable. COMPARISON: Ct 12 / 9 / 25. CLINICAL HISTORY: Pulmonary embolism (PE) suspected, high prob. chest pain FINDINGS: PULMONARY ARTERIES: Pulmonary arteries are adequately opacified for evaluation. No acute pulmonary embolus. Main pulmonary artery is normal in caliber. MEDIASTINUM: The heart and pericardium demonstrate no acute abnormality. There is no acute abnormality of the thoracic aorta. LYMPH NODES: No mediastinal, hilar or axillary lymphadenopathy. LUNGS AND PLEURA: The lungs are without acute process. No focal consolidation or pulmonary edema. No evidence of pleural effusion or pneumothorax. UPPER ABDOMEN: Limited images of the upper abdomen are unremarkable. SOFT TISSUES AND BONES: No acute bone or soft tissue abnormality. IMPRESSION: 1. No pulmonary embolism or acute pulmonary abnormality. Electronically signed by: Norman Gatlin MD 07/15/2024 11:06 PM EST RP Workstation: [id number]     Procedures   Medications Ordered in the ED  iohexol  (OMNIPAQUE ) 350 MG/ML injection 75 mL (75 mLs Intravenous Contrast Given 07/15/24 2241)                                    Medical Decision Making Amount and/or Complexity of Data Reviewed Labs: ordered. Radiology: ordered.  Risk Prescription drug management.   Patient presents with worsening chest pain and pleuritic chest pain after starting Eliquis.  She was diagnosed with PE based on his CT coronary artery scan at the beginning of December.  Given worsening symptoms and concern for possible larger clot burden or central clot, will repeat CT imaging.  Patient is clinically stable.  She does not have hypoxia hypotension or tachycardia.  CT PE study reviewed by radiology no acute PE no acute findings.  At this time given negative PE study my recommendation to the patient is to continue the  Eliquis as prescribed.  She needs to review this with her doctor and determine between her PCP, hematology her need for continued Eliquis use.     Final diagnoses:  Pleurisy    ED Discharge Orders     None          Armenta Canning, MD 07/15/24 2348  "

## 2024-07-16 NOTE — ED Notes (Signed)
 Reviewed discharge instructions and follow-up care with pt. Pt verbalized understanding and had no further questions. Pt exited ED without complications.

## 2024-08-16 ENCOUNTER — Inpatient Hospital Stay

## 2024-08-16 ENCOUNTER — Inpatient Hospital Stay: Admitting: Nurse Practitioner

## 2024-08-30 ENCOUNTER — Inpatient Hospital Stay: Admitting: Hematology

## 2024-08-30 ENCOUNTER — Inpatient Hospital Stay
